# Patient Record
Sex: Male | Born: 1960 | Race: Black or African American | Hispanic: No | Marital: Married | Smoking: Former smoker
Health system: Southern US, Community
[De-identification: ages and names within clinical notes are randomized; demographics above are authoritative.]

## PROBLEM LIST (undated history)

## (undated) DIAGNOSIS — E785 Hyperlipidemia, unspecified: Secondary | ICD-10-CM

## (undated) DIAGNOSIS — R569 Unspecified convulsions: Secondary | ICD-10-CM

## (undated) DIAGNOSIS — I10 Essential (primary) hypertension: Secondary | ICD-10-CM

## (undated) DIAGNOSIS — I671 Cerebral aneurysm, nonruptured: Secondary | ICD-10-CM

## (undated) DIAGNOSIS — I639 Cerebral infarction, unspecified: Secondary | ICD-10-CM

## (undated) HISTORY — DX: Essential (primary) hypertension: I10

## (undated) HISTORY — DX: Hyperlipidemia, unspecified: E78.5

---

## 1996-12-08 DIAGNOSIS — R569 Unspecified convulsions: Secondary | ICD-10-CM

## 1996-12-08 HISTORY — DX: Unspecified convulsions: R56.9

## 1998-09-11 ENCOUNTER — Emergency Department (HOSPITAL_COMMUNITY): Admission: EM | Admit: 1998-09-11 | Discharge: 1998-09-11 | Payer: Self-pay | Admitting: Emergency Medicine

## 1999-02-11 ENCOUNTER — Encounter: Payer: Self-pay | Admitting: Emergency Medicine

## 1999-02-11 ENCOUNTER — Emergency Department (HOSPITAL_COMMUNITY): Admission: EM | Admit: 1999-02-11 | Discharge: 1999-02-11 | Payer: Self-pay | Admitting: Emergency Medicine

## 2008-01-20 ENCOUNTER — Emergency Department (HOSPITAL_COMMUNITY): Admission: EM | Admit: 2008-01-20 | Discharge: 2008-01-20 | Payer: Self-pay | Admitting: Family Medicine

## 2012-02-06 DIAGNOSIS — I639 Cerebral infarction, unspecified: Secondary | ICD-10-CM

## 2012-02-06 HISTORY — DX: Cerebral infarction, unspecified: I63.9

## 2012-02-11 ENCOUNTER — Encounter (HOSPITAL_COMMUNITY): Payer: Self-pay | Admitting: *Deleted

## 2012-02-11 ENCOUNTER — Other Ambulatory Visit: Payer: Self-pay

## 2012-02-11 ENCOUNTER — Emergency Department (HOSPITAL_COMMUNITY): Payer: BC Managed Care – PPO

## 2012-02-11 ENCOUNTER — Inpatient Hospital Stay (HOSPITAL_COMMUNITY): Payer: BC Managed Care – PPO

## 2012-02-11 ENCOUNTER — Inpatient Hospital Stay (HOSPITAL_COMMUNITY)
Admission: EM | Admit: 2012-02-11 | Discharge: 2012-02-13 | DRG: 014 | Disposition: A | Discharge status: Home / Self Care | Payer: BC Managed Care – PPO | Source: Ambulatory Visit | Attending: Internal Medicine | Admitting: Internal Medicine

## 2012-02-11 DIAGNOSIS — Z7982 Long term (current) use of aspirin: Secondary | ICD-10-CM

## 2012-02-11 DIAGNOSIS — Z79899 Other long term (current) drug therapy: Secondary | ICD-10-CM

## 2012-02-11 DIAGNOSIS — Z8673 Personal history of transient ischemic attack (TIA), and cerebral infarction without residual deficits: Secondary | ICD-10-CM

## 2012-02-11 DIAGNOSIS — I10 Essential (primary) hypertension: Secondary | ICD-10-CM | POA: Diagnosis present

## 2012-02-11 DIAGNOSIS — I6350 Cerebral infarction due to unspecified occlusion or stenosis of unspecified cerebral artery: Principal | ICD-10-CM | POA: Diagnosis present

## 2012-02-11 DIAGNOSIS — I639 Cerebral infarction, unspecified: Secondary | ICD-10-CM | POA: Diagnosis present

## 2012-02-11 DIAGNOSIS — Q2111 Secundum atrial septal defect: Secondary | ICD-10-CM

## 2012-02-11 DIAGNOSIS — Q211 Atrial septal defect: Secondary | ICD-10-CM

## 2012-02-11 DIAGNOSIS — I671 Cerebral aneurysm, nonruptured: Secondary | ICD-10-CM | POA: Diagnosis present

## 2012-02-11 DIAGNOSIS — R269 Unspecified abnormalities of gait and mobility: Secondary | ICD-10-CM | POA: Diagnosis present

## 2012-02-11 HISTORY — DX: Unspecified convulsions: R56.9

## 2012-02-11 HISTORY — DX: Cerebral infarction, unspecified: I63.9

## 2012-02-11 LAB — CBC
HCT: 43.5 % (ref 39.0–52.0)
Hemoglobin: 14.9 g/dL (ref 13.0–17.0)
MCH: 29.3 pg (ref 26.0–34.0)
MCHC: 34.3 g/dL (ref 30.0–36.0)
MCV: 85.6 fL (ref 78.0–100.0)
Platelets: 251 K/uL (ref 150–400)
RBC: 5.08 MIL/uL (ref 4.22–5.81)
RDW: 13.4 % (ref 11.5–15.5)
WBC: 6.3 K/uL (ref 4.0–10.5)

## 2012-02-11 LAB — COMPREHENSIVE METABOLIC PANEL
ALT: 17 U/L (ref 0–53)
BUN: 8 mg/dL (ref 6–23)
Calcium: 9.6 mg/dL (ref 8.4–10.5)
GFR calc Af Amer: >90 mL/min (ref >90)
Glucose, Bld: 73 mg/dL (ref 70–99)
Sodium: 139 mEq/L (ref 135–145)
Total Protein: 7.5 g/dL (ref 6.0–8.3)

## 2012-02-11 LAB — DIFFERENTIAL
Basophils Relative: 0 % (ref 0–1)
Eosinophils Absolute: 0.1 10*3/uL (ref 0.0–0.7)
Eosinophils Relative: 1 % (ref 0–5)
Lymphs Abs: 1.7 10*3/uL (ref 0.7–4.0)

## 2012-02-11 LAB — HEMOGLOBIN A1C
Hgb A1c MFr Bld: 5.7 % — ABNORMAL HIGH (ref <5.7)
Mean Plasma Glucose: 117 mg/dL — ABNORMAL HIGH (ref <117)

## 2012-02-11 LAB — GLUCOSE, CAPILLARY: Glucose-Capillary: 95 mg/dL (ref 70–99)

## 2012-02-11 LAB — POCT I-STAT, CHEM 8
Chloride: 103 mEq/L (ref 96–112)
HCT: 47 % (ref 39.0–52.0)
Potassium: 3.7 mEq/L (ref 3.5–5.1)

## 2012-02-11 LAB — TROPONIN I: Troponin I: <0.3 ng/mL (ref <0.30)

## 2012-02-11 LAB — PROTIME-INR
INR: 0.99 (ref 0.00–1.49)
Prothrombin Time: 13.3 s (ref 11.6–15.2)

## 2012-02-11 MED ORDER — SODIUM CHLORIDE 0.9 % IV SOLN
250.0000 mL | INTRAVENOUS | Status: DC | PRN
  Administered 2012-02-13: 500 mL via INTRAVENOUS

## 2012-02-11 MED ORDER — ACETAMINOPHEN 325 MG PO TABS: 650.0000 mg | ORAL_TABLET | ORAL | Status: DC | PRN

## 2012-02-11 MED ORDER — SODIUM CHLORIDE 0.9 % IJ SOLN
3.0000 mL | Freq: Two times a day (BID) | INTRAMUSCULAR | Status: DC
  Administered 2012-02-11 – 2012-02-12 (×4): 3 mL via INTRAVENOUS

## 2012-02-11 MED ORDER — CLOPIDOGREL BISULFATE 75 MG PO TABS
75.0000 mg | ORAL_TABLET | Freq: Every day | ORAL | Status: DC
  Administered 2012-02-12: 75 mg via ORAL
  Filled 2012-02-11 (×3): qty 1

## 2012-02-11 MED ORDER — SODIUM CHLORIDE 0.9 % IJ SOLN: 3.0000 mL | INTRAMUSCULAR | Status: DC | PRN

## 2012-02-11 MED ORDER — ACETAMINOPHEN 650 MG RE SUPP: 650.0000 mg | RECTAL | Status: DC | PRN

## 2012-02-11 MED ORDER — SENNOSIDES-DOCUSATE SODIUM 8.6-50 MG PO TABS: 1.0000 | ORAL_TABLET | Freq: Every evening | ORAL | Status: DC | PRN

## 2012-02-11 MED ORDER — ONDANSETRON HCL 4 MG/2ML IJ SOLN: 4.0000 mg | Freq: Four times a day (QID) | INTRAMUSCULAR | Status: DC | PRN

## 2012-02-11 MED ORDER — ENOXAPARIN SODIUM 40 MG/0.4ML ~~LOC~~ SOLN
40.0000 mg | SUBCUTANEOUS | Status: DC
  Administered 2012-02-11 – 2012-02-12 (×2): 40 mg via SUBCUTANEOUS
  Filled 2012-02-11 (×3): qty 0.4

## 2012-02-11 MED ORDER — ASPIRIN EC 81 MG PO TBEC
81.0000 mg | DELAYED_RELEASE_TABLET | Freq: Every day | ORAL | Status: DC
  Administered 2012-02-11: 81 mg via ORAL
  Filled 2012-02-11 (×2): qty 1

## 2012-02-11 MED ORDER — ADULT MULTIVITAMIN W/MINERALS CH
1.0000 | ORAL_TABLET | Freq: Every day | ORAL | Status: DC
  Administered 2012-02-11 – 2012-02-13 (×3): 1 via ORAL
  Filled 2012-02-11 (×3): qty 1

## 2012-02-11 MED ORDER — HYDRALAZINE HCL 20 MG/ML IJ SOLN
10.0000 mg | Freq: Four times a day (QID) | INTRAMUSCULAR | Status: DC | PRN
  Filled 2012-02-11 (×2): qty 0.5

## 2012-02-11 NOTE — ED Notes (Signed)
Patient transported to MRI 

## 2012-02-11 NOTE — Consult Note (Signed)
[facility] STROKE CONSULT NOTE       Chief Complaint: Left arm and leg tingling and difficulty with gait.    HPI:    Rodney Page is an 51 y.o. male who came to ED after >24 hours of left arm and leg tingling along with some noted difficulty with gait.  He awoke on 3/5 with symptoms and they did not seem to be improving.  In 2009 he had similar tingling in right arm --stroke workup revealed no abnormality.  CT head today showed equivocal stroke involving the right frontal region. 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 No acute right frontal stroke was seen on MRI. MRA showed a 2 mm aneurysm lateral aspect of the left internal carotid artery cavernous segment, as well as a prominent infundibulum at the origin of the left posterior communicating artery and left anterior choroidal artery. He has been on ASA essentially daily, 81 mg.  LSN: evening 02/09/12 tPA Given: No: out of window  Rankin: 0  Past Medical History  Diagnosis Date  . Seizure   . Stroke     possible stroke 2009 per wife    History reviewed. No pertinent past surgical history.  No family history on file. Social History:  does not have a smoking history on file. He does not have any smokeless tobacco history on file. He reports that he does not drink alcohol. His drug history not on file.  Allergies:  Allergies  Allergen Reactions  . Iodinated Diagnostic Agents Other (See Comments)    MRI DYE    Medications:    Takes ASA but not on a regular basis  ROS: History obtained from the patient  General ROS: negative for - chills, fatigue, fever, night sweats, weight gain or weight loss Psychological ROS: negative for - behavioral disorder, hallucinations, memory difficulties, mood swings or suicidal ideation Ophthalmic ROS: negative for - blurry vision, double vision, eye pain or loss of vision ENT ROS: negative for - epistaxis,  nasal discharge, oral lesions, sore throat, tinnitus or vertigo Allergy and Immunology ROS: negative for - hives or itchy/watery eyes Hematological and Lymphatic ROS: negative for - bleeding problems, bruising or swollen lymph nodes Endocrine ROS: negative for - galactorrhea, hair pattern changes, polydipsia/polyuria or temperature intolerance Respiratory ROS: negative for - cough, hemoptysis, shortness of breath or wheezing Cardiovascular ROS: negative for - chest pain, dyspnea on exertion, edema or irregular heartbeat Gastrointestinal ROS: negative for - abdominal pain, diarrhea, hematemesis, nausea/vomiting or stool incontinence Genito-Urinary ROS: negative for - dysuria, hematuria, incontinence or urinary frequency/urgency Musculoskeletal ROS: negative for - joint swelling or muscular weakness Neurological ROS: as noted in HPI Dermatological ROS: negative for rash and skin lesion changes   Physical Examination: Blood pressure 149/85, pulse 68, temperature 97.7 F (36.5 C), resp. rate 14, SpO2 100.00%.  Neurologic Examination:   Mental Status: Alert, oriented, thought content appropriate.  Speech fluent without evidence of aphasia. Able to follow 3 step commands without difficulty. Cranial Nerves: II-Visual fields grossly intact. III/IV/VI-Extraocular movements intact.  Pupils reactive bilaterally. V/VII-Smile asymmetric on left NL fold VIII-grossly intact IX/X-normal gag XI-bilateral shoulder shrug XII-midline tongue extension Motor: 5/5 bilaterally with normal tone and bulk.  Maybe a slight drift with left arm, left weakness in grip Sensory: Pinprick and light touch intact throughout, bilaterally Deep Tendon Reflexes: 2+ and symmetric throughout Plantars: Downgoing bilaterally Cerebellar: Normal finger-to-nose, normal  rapid alternating movements and normal heel-to-shin test.  Normal gait except when heel to toe he will veer to the left.       No results found for this  basename: cbc, bmp, coags, chol, tri, ldl, hga1c, tsh, rpr   Ct Head Wo Contrast  02/11/2012  *RADIOLOGY REPORT*  Clinical Data: Stroke symptoms.  Dizziness.  Lethargy.  CT HEAD WITHOUT CONTRAST  Technique:  Contiguous axial images were obtained from the base of the skull through the vertex without contrast.  Comparison: [facility] Radiology MRI from 03/14/2008.  Findings: There is no evidence for acute hemorrhage, hydrocephalus, mass lesion, or abnormal extra-axial fluid collection.  A small area of hypo attenuation is identified in the right frontal deep white matter.  No definite abnormality is seen at this location on the previous MRI.  The visualized paranasal sinuses and mastoid air cells are clear.  IMPRESSION: A small area of hypo attenuation identified in the posterior right frontal white matter could be an area of subacute ischemia.  MRI may prove helpful to further evaluate.  These test results were telephoned by me to Dr. Lynelle Doctor at the time of interpretation (1120 hours on 02/11/2012).  Original Report Authenticated By: ERIC A. MANSELL, M.D.   [name] Head Wo Contrast  02/11/2012  *RADIOLOGY REPORT*  Clinical Data:  Off balance.  Left leg and left arm draining. History of seizures.  MRI HEAD WITHOUT CONTRAST MRA HEAD WITHOUT CONTRAST  Technique: Multiplanar, multiecho pulse sequences of the brain and surrounding structures were obtained according to standard protocol without intravenous contrast.  Angiographic images of the head were obtained using MRA technique without contrast.  Comparison: 02/11/2012 CT.  03/14/2008 MR.  MRI HEAD  Findings:  Small acute non hemorrhagic infarct posterior limb of the right internal capsule.  Remote infarcts within the thalami, basal ganglia and pons.  Progressive white matter type changes most likely related to result of small vessel disease given the surrounding findings.  Result of vasculitis not entirely excluded.  No hydrocephalus.  No intracranial mass lesion  detected on this unenhanced exam.  No intracranial hemorrhage.  Mild degenerative changes C1-2 articulation.  Primary veins extending through parietal calvarium.  Appearance is unchanged.  IMPRESSION: Small acute non hemorrhagic infarct posterior limb of the right internal capsule.  Remote infarcts within the thalami, basal ganglia and pons.  Progressive white matter type changes most likely related to result of small vessel disease  MRA HEAD  Findings: Mild narrowing petrous segment of the internal carotid artery bilaterally greater on the right.  Ectasia of the distal vertical cervical segment of the internal carotid artery bilaterally greater on the right.  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  Stability can be confirmed on follow-up.  Bulbous appearance of the left middle cerebral artery bifurcation from which vessels arise.  Fetal type origin of the right posterior cerebral artery.  Middle cerebral artery branch vessel irregularity.  Smooth narrowing left vertebral artery after the takeoff of the left PICA.  Mild irregularity of the mid aspect of the basilar artery with associated mild narrowing.  Moderate narrowing P1 segments right posterior cerebral artery.  Mild narrowing posterior cerebral artery branch vessels.  IMPRESSION: Intracranial atherosclerotic type changes as detailed above.  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  Prominent infundibulum at the origin of the left  posterior communicating artery and left anterior choroidal artery.  Stability can be confirmed on follow-up.  Results discussed with Dr. Lynelle Doctor 02/11/2012 2:16 p.m.  Original Report Authenticated By: Fuller Canada, M.D.   Mr Brain Wo Contrast  02/11/2012  *RADIOLOGY REPORT*  Clinical Data:  Off balance.  Left leg and left arm  draining. History of seizures.  MRI HEAD WITHOUT CONTRAST MRA HEAD WITHOUT CONTRAST  Technique: Multiplanar, multiecho pulse sequences of the brain and surrounding structures were obtained according to standard protocol without intravenous contrast.  Angiographic images of the head were obtained using MRA technique without contrast.  Comparison: 02/11/2012 CT.  03/14/2008 MR.  MRI HEAD  Findings:  Small acute non hemorrhagic infarct posterior limb of the right internal capsule.  Remote infarcts within the thalami, basal ganglia and pons.  Progressive white matter type changes most likely related to result of small vessel disease given the surrounding findings.  Result of vasculitis not entirely excluded.  No hydrocephalus.  No intracranial mass lesion detected on this unenhanced exam.  No intracranial hemorrhage.  Mild degenerative changes C1-2 articulation.  Primary veins extending through parietal calvarium.  Appearance is unchanged.  IMPRESSION: Small acute non hemorrhagic infarct posterior limb of the right internal capsule.  Remote infarcts within the thalami, basal ganglia and pons.  Progressive white matter type changes most likely related to result of small vessel disease  MRA HEAD  Findings: Mild narrowing petrous segment of the internal carotid artery bilaterally greater on the right.  Ectasia of the distal vertical cervical segment of the internal carotid artery bilaterally greater on the right.  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  Stability can be confirmed on follow-up.  Bulbous appearance of the left middle cerebral artery bifurcation from which vessels arise.  Fetal type origin of the right posterior cerebral artery.  Middle cerebral artery branch vessel irregularity.  Smooth narrowing left vertebral artery after the takeoff of the left  PICA.  Mild irregularity of the mid aspect of the basilar artery with associated mild narrowing.  Moderate narrowing P1 segments right posterior cerebral artery.  Mild narrowing posterior cerebral artery branch vessels.  IMPRESSION: Intracranial atherosclerotic type changes as detailed above.  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  Stability can be confirmed on follow-up.  Results discussed with Dr. Lynelle Doctor 02/11/2012 2:16 p.m.  Original Report Authenticated By: Fuller Canada, M.D.    Assessment:   1. 51 y.o. male with new acute small acute non hemorrhagic infarct posterior limb of the right internal capsule.   2. 2mm left ICA cavernous segment aneurysm  3. 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  Stroke Risk Factors - none documented  Plan: 1. HgbA1c, fasting lipid panel 2. PT consult, OT consult, Speech consult 3. Echocardiogram 4. Carotid dopplers 5. Prophylactic therapy-Antiplatelet med: Plavix 75 mg daily 6. Risk factor modification 7. Further cerebral vascular workup per stroke team    Felicie Morn PA-C [facility] [phone]  02/11/2012, 2:46 PM

## 2012-02-11 NOTE — ED Notes (Signed)
Attempted to call report and they will call me back in 5 min

## 2012-02-11 NOTE — ED Notes (Signed)
Pt still in mri 

## 2012-02-11 NOTE — ED Notes (Signed)
Pt is here after seeing PCP who referred him here due to stroke symptoms that began yesterday am.  LSN 3/5 12:30 am.   Pt woke up yesterday at 6:30 am feeling "a little off balance"  Pt states that he "felt unusual" and tired during the day.  Pt states that his left leg and left arm felt "unusual, not normal" and he was tired and "dragging".  He also had some slurred speech during the day.  Pt has mild left facial droop here on arrival to ED, he also has minimal left arm weakness, very minimal left arm drift.  No dizziness.  Pt is alert and oriented

## 2012-02-11 NOTE — ED Notes (Signed)
Allergy "MRI dye"

## 2012-02-11 NOTE — Progress Notes (Signed)
Utilization review completed.  

## 2012-02-11 NOTE — ED Notes (Signed)
Checked patient blood sugar it was 95 notified RN Sabrina of blood sugar

## 2012-02-11 NOTE — ED Provider Notes (Signed)
History     CSN: [id number]  Arrival date & time 02/11/12  4098   First MD Initiated Contact with Patient 02/11/12 [phone]      Chief Complaint  Patient presents with  . Cerebrovascular Accident    (Consider location/radiation/quality/duration/timing/severity/associated sxs/prior treatment) HPI The patient was sent to the emergency room for evaluation of a possible stroke. The patient states he went to bed the evening before yesterday and felt fine. He woke up yesterday morning and noticed that his balance fell off. He felt like he was falling to the left. He felt fatigued during the day. He also felt like his left leg and left arm were dragging and felt a little bit. He did not notice any trouble with his speech however family members and coworkers thought it seems different. The patient has a history of a possible stroke back in 2009. He has history of seizures but has not had any in many years. The symptoms have persisted. Nothing seems to make them better or worse. He still feels like he is falling a little bit to the left side. Past Medical History  Diagnosis Date  . Seizure   . Stroke     possible stroke 2009 per wife    History reviewed. No pertinent past surgical history.  No family history on file.  History  Substance Use Topics  . Smoking status: Not on file  . Smokeless tobacco: Not on file  . Alcohol Use: No      Review of Systems  All other systems reviewed and are negative.    Allergies  Iodinated diagnostic agents  Home Medications  No current outpatient prescriptions on file.  BP 161/88  Pulse 72  Temp 98.7 F (37.1 C)  Resp 22  SpO2 100%  Physical Exam  Nursing note and vitals reviewed. Constitutional: He is oriented to person, place, and time. He appears well-developed and well-nourished. No distress.  HENT:  Head: Normocephalic and atraumatic.  Right Ear: External ear normal.  Left Ear: External ear normal.  Mouth/Throat: Oropharynx is  clear and moist.  Eyes: Conjunctivae are normal. Right eye exhibits no discharge. Left eye exhibits no discharge. No scleral icterus.  Neck: Neck supple. No tracheal deviation present.  Cardiovascular: Normal rate, regular rhythm and intact distal pulses.   Pulmonary/Chest: Effort normal and breath sounds normal. No stridor. No respiratory distress. He has no wheezes. He has no rales.  Abdominal: Soft. Bowel sounds are normal. He exhibits no distension. There is no tenderness. There is no rebound and no guarding.  Musculoskeletal: He exhibits no edema and no tenderness.  Neurological: He is alert and oriented to person, place, and time. He has normal strength. He is not disoriented. No cranial nerve deficit or sensory deficit. He exhibits normal muscle tone. He displays no seizure activity. Gait abnormal.       Slight pronator drift of the left upper extremity and slight drift of left lower extremity, able to hold both legs off bed for 5 seconds, sensation intact in all extremities, no visual field cuts, no left or right sided neglect  Skin: Skin is warm and dry. No rash noted.  Psychiatric: He has a normal mood and affect.    ED Course  Procedures (including critical care time)  Date: 02/11/2012  Rate: 71  Rhythm: normal sinus rhythm  QRS Axis: normal  Intervals: normal  ST/T Wave abnormalities: normal except borderline T-wave abnormalities in the lateral leads  Conduction Disutrbances:none  Narrative Interpretation: Consider left atrial  abnormality  Old EKG Reviewed: none available    Labs Reviewed  PROTIME-INR  APTT  CBC  DIFFERENTIAL  COMPREHENSIVE METABOLIC PANEL  TROPONIN I  POCT I-STAT, CHEM 8   Ct Head Wo Contrast  02/11/2012  *RADIOLOGY REPORT*  Clinical Data: Stroke symptoms.  Dizziness.  Lethargy.  CT HEAD WITHOUT CONTRAST  Technique:  Contiguous axial images were obtained from the base of the skull through the vertex without contrast.  Comparison: [facility]  Radiology MRI from 03/14/2008.  Findings: There is no evidence for acute hemorrhage, hydrocephalus, mass lesion, or abnormal extra-axial fluid collection.  A small area of hypo attenuation is identified in the right frontal deep white matter.  No definite abnormality is seen at this location on the previous MRI.  The visualized paranasal sinuses and mastoid air cells are clear.  IMPRESSION: A small area of hypo attenuation identified in the posterior right frontal white matter could be an area of subacute ischemia.  MRI may prove helpful to further evaluate.  These test results were telephoned by me to Dr. Lynelle Doctor at the time of interpretation (1120 hours on 02/11/2012).  Original Report Authenticated By: ERIC A. MANSELL, M.D.     1. Cerebrovascular accident (stroke)       MDM  The patient's CT scan is suggestive of a subacute stroke. This does correlate with the symptoms that he is experiencing today. The patient is not a code stroke/TPA candidate and that the onset was greater than 24 hours ago. However, the patient will be admitted to the hospital for further treatment and evaluation of this subacute stroke.        Celene Kras, MD 02/11/12 1149

## 2012-02-11 NOTE — H&P (Signed)
Patient's PCP: No primary provider on file.  Chief Complaint: difficulty walking and weakness  History of Present Illness: Rodney Page is a 51 y.o. african [place] male who woke up yesterday morning and noticed that his balance felt off. He felt like he was falling to the left. He felt fatigued during the day. He also felt like his left leg and left arm were dragging.  He did not notice any trouble with his speech however his  family members and coworkers thought it seemed different. The patient has a history of a possible stroke back in 2009. He has history of seizures but has not had any in many years. The symptoms have persisted.  He still feels like he is falling a little bit to the left side. No CP,no SOB   Meds: Scheduled Meds:    . aspirin EC  81 mg Oral Daily  . clopidogrel  75 mg Oral Q breakfast  . enoxaparin  40 mg Subcutaneous Q24H  . mulitivitamin with minerals  1 tablet Oral Daily  . sodium chloride  3 mL Intravenous Q12H   Continuous Infusions:  PRN Meds:.sodium chloride, acetaminophen, acetaminophen, ondansetron (ZOFRAN) IV, senna-docusate, sodium chloride Allergies: Iodinated diagnostic agents Past Medical History  Diagnosis Date  . Seizure   . Stroke     possible stroke 2009 per wife   History reviewed. No pertinent past surgical history. No family history on file. History   Social History  . Marital Status: Married    Spouse Name: N/A    Number of Children: N/A  . Years of Education: N/A   Occupational History  . Not on file.   Social History Main Topics  . Smoking status: Not on file  . Smokeless tobacco: Not on file  . Alcohol Use: No  . Drug Use: No  . Sexually Active:    Other Topics Concern  . Not on file   Social History Narrative  . No narrative on file   Review of Systems: All systems reviewed with the patient and positive as per history of present illness, otherwise all other systems are negative. Pertinent items are noted  in HPI.  Physical Exam: Blood pressure 181/99, pulse 73, temperature 97.2 F (36.2 C), temperature source Oral, resp. rate 17, height 6' (1.829 m), weight 68.9 kg (151 lb 14.4 oz), SpO2 100.00%. General: Awake, Oriented x3, No acute distress, family at bedside HEENT: EOMI, Moist mucous membranes Neck: Supple CV: S1 and S2, RRR Lungs: Clear to ascultation bilaterally, no wheezing Abdomen: Soft, Nontender, Nondistended, +bowel sounds. Ext: Good pulses. Trace edema. No clubbing or cyanosis noted. Neuro: mild facial droop, weakness on L side    Lab results:  Basename 02/11/12 1020 02/11/12 1012  NA 140 139  K 3.7 3.6  CL 103 104  CO2 -- 28  GLUCOSE 73 73  BUN 8 8  CREATININE 0.80 0.70  CALCIUM -- 9.6  MG -- --  PHOS -- --    Basename 02/11/12 1012  AST 15  ALT 17  ALKPHOS 84  BILITOT 0.5  PROT 7.5  ALBUMIN 4.2   No results found for this basename: LIPASE:2,AMYLASE:2 in the last 72 hours  Basename 02/11/12 1020 02/11/12 1012  WBC -- 6.3  NEUTROABS -- 4.2  HGB 16.0 14.9  HCT 47.0 43.5  MCV -- 85.6  PLT -- 251    Basename 02/11/12 1012  CKTOTAL --  CKMB --  CKMBINDEX --  TROPONINI <0.30   No components found with this basename:  POCBNP:3 No results found for this basename: DDIMER in the last 72 hours No results found for this basename: HGBA1C:2 in the last 72 hours No results found for this basename: CHOL:2,HDL:2,LDLCALC:2,TRIG:2,CHOLHDL:2,LDLDIRECT:2 in the last 72 hours No results found for this basename: TSH,T4TOTAL,FREET3,T3FREE,THYROIDAB in the last 72 hours No results found for this basename: VITAMINB12:2,FOLATE:2,FERRITIN:2,TIBC:2,IRON:2,RETICCTPCT:2 in the last 72 hours Imaging results:  Ct Head Wo Contrast  02/11/2012  *RADIOLOGY REPORT*  Clinical Data: Stroke symptoms.  Dizziness.  Lethargy.  CT HEAD WITHOUT CONTRAST  Technique:  Contiguous axial images were obtained from the base of the skull through the vertex without contrast.  Comparison:  [facility] Radiology MRI from 03/14/2008.  Findings: There is no evidence for acute hemorrhage, hydrocephalus, mass lesion, or abnormal extra-axial fluid collection.  A small area of hypo attenuation is identified in the right frontal deep white matter.  No definite abnormality is seen at this location on the previous MRI.  The visualized paranasal sinuses and mastoid air cells are clear.  IMPRESSION: A small area of hypo attenuation identified in the posterior right frontal white matter could be an area of subacute ischemia.  MRI may prove helpful to further evaluate.  These test results were telephoned by me to Dr. Lynelle Doctor at the time of interpretation (1120 hours on 02/11/2012).  Original Report Authenticated By: ERIC A. MANSELL, M.D.   [name] Head Wo Contrast  02/11/2012  *RADIOLOGY REPORT*  Clinical Data:  Off balance.  Left leg and left arm draining. History of seizures.  MRI HEAD WITHOUT CONTRAST MRA HEAD WITHOUT CONTRAST  Technique: Multiplanar, multiecho pulse sequences of the brain and surrounding structures were obtained according to standard protocol without intravenous contrast.  Angiographic images of the head were obtained using MRA technique without contrast.  Comparison: 02/11/2012 CT.  03/14/2008 MR.  MRI HEAD  Findings:  Small acute non hemorrhagic infarct posterior limb of the right internal capsule.  Remote infarcts within the thalami, basal ganglia and pons.  Progressive white matter type changes most likely related to result of small vessel disease given the surrounding findings.  Result of vasculitis not entirely excluded.  No hydrocephalus.  No intracranial mass lesion detected on this unenhanced exam.  No intracranial hemorrhage.  Mild degenerative changes C1-2 articulation.  Primary veins extending through parietal calvarium.  Appearance is unchanged.  IMPRESSION: Small acute non hemorrhagic infarct posterior limb of the right internal capsule.  Remote infarcts within the thalami, basal  ganglia and pons.  Progressive white matter type changes most likely related to result of small vessel disease  MRA HEAD  Findings: Mild narrowing petrous segment of the internal carotid artery bilaterally greater on the right.  Ectasia of the distal vertical cervical segment of the internal carotid artery bilaterally greater on the right.  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  Stability can be confirmed on follow-up.  Bulbous appearance of the left middle cerebral artery bifurcation from which vessels arise.  Fetal type origin of the right posterior cerebral artery.  Middle cerebral artery branch vessel irregularity.  Smooth narrowing left vertebral artery after the takeoff of the left PICA.  Mild irregularity of the mid aspect of the basilar artery with associated mild narrowing.  Moderate narrowing P1 segments right posterior cerebral artery.  Mild narrowing posterior cerebral artery branch vessels.  IMPRESSION: Intracranial atherosclerotic type changes as detailed above.  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  Stability can be confirmed on follow-up.  Results discussed with Dr. Lynelle Doctor 02/11/2012 2:16 p.m.  Original Report Authenticated By: Fuller Canada, M.D.   Mr Brain Wo Contrast  02/11/2012  *RADIOLOGY REPORT*  Clinical Data:  Off balance.  Left leg and left arm draining. History of seizures.  MRI HEAD WITHOUT CONTRAST MRA HEAD WITHOUT CONTRAST  Technique: Multiplanar, multiecho pulse sequences of the brain and surrounding structures were obtained according to standard protocol without intravenous contrast.  Angiographic images of the head were obtained using MRA technique without  contrast.  Comparison: 02/11/2012 CT.  03/14/2008 MR.  MRI HEAD  Findings:  Small acute non hemorrhagic infarct posterior limb of the right internal capsule.  Remote infarcts within the thalami, basal ganglia and pons.  Progressive white matter type changes most likely related to result of small vessel disease given the surrounding findings.  Result of vasculitis not entirely excluded.  No hydrocephalus.  No intracranial mass lesion detected on this unenhanced exam.  No intracranial hemorrhage.  Mild degenerative changes C1-2 articulation.  Primary veins extending through parietal calvarium.  Appearance is unchanged.  IMPRESSION: Small acute non hemorrhagic infarct posterior limb of the right internal capsule.  Remote infarcts within the thalami, basal ganglia and pons.  Progressive white matter type changes most likely related to result of small vessel disease  MRA HEAD  Findings: Mild narrowing petrous segment of the internal carotid artery bilaterally greater on the right.  Ectasia of the distal vertical cervical segment of the internal carotid artery bilaterally greater on the right.  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  Stability can be confirmed on follow-up.  Bulbous appearance of the left middle cerebral artery bifurcation from which vessels arise.  Fetal type origin of the right posterior cerebral artery.  Middle cerebral artery branch vessel irregularity.  Smooth narrowing left vertebral artery after the takeoff of the left PICA.  Mild irregularity of the mid aspect of the basilar artery with associated mild narrowing.  Moderate narrowing P1 segments right posterior cerebral artery.  Mild narrowing posterior cerebral artery branch vessels.  IMPRESSION: Intracranial atherosclerotic type changes as detailed above.  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  Stability can be confirmed on follow-up.  Results discussed with Dr. Lynelle Doctor 02/11/2012 2:16 p.m.  Original Report Authenticated By: Fuller Canada, M.D.     Assessment & Plan by Problem:   *CVA (cerebral infarction)- ? TIA in 2009- neurology consulted, full stroke w/up, MRI/Carotids/echo FLP, ASA   HTN (hypertension)- BP stable, permissive HTN for now   Time spent on admission, talking to the patient, and coordinating care was: 31 mins.  Brodi Nery, DO 02/11/2012, 3:11 PM

## 2012-02-11 NOTE — ED Notes (Signed)
No old ekgs found in muse. 

## 2012-02-12 DIAGNOSIS — I319 Disease of pericardium, unspecified: Secondary | ICD-10-CM

## 2012-02-12 LAB — LIPID PANEL
HDL: 59 mg/dL (ref >39)
LDL Cholesterol: 53 mg/dL (ref 0–99)
Total CHOL/HDL Ratio: 2.2 RATIO
Triglycerides: 98 mg/dL (ref <150)

## 2012-02-12 LAB — HOMOCYSTEINE: Homocysteine: 6.5 umol/L (ref 4.0–15.4)

## 2012-02-12 LAB — HEMOGLOBIN A1C: Hgb A1c MFr Bld: 5.7 % — ABNORMAL HIGH (ref <5.7)

## 2012-02-12 NOTE — Progress Notes (Signed)
VASCULAR LAB PRELIMINARY  PRELIMINARY  PRELIMINARY  PRELIMINARY  Carotid Dopplers completed.    Preliminary report:  No ICA stenosis.  Vertebral artery flow is antegrade.  Sherren Kerns Breaks, 02/12/2012, 10:54 AM

## 2012-02-12 NOTE — Progress Notes (Signed)
  Echocardiogram 2D Echocardiogram has been performed.  Cathie Beams Deneen 02/12/2012, 11:53 AM

## 2012-02-12 NOTE — Progress Notes (Signed)
Occupational Therapy Evaluation Patient Details Name: Rodney Page MRN: [id number] DOB: 09-13-1961 Today's Date: 02/12/2012  Problem List:  Patient Active Problem List  Diagnoses  . CVA (cerebral infarction)  . HTN (hypertension)    Past Medical History:  Past Medical History  Diagnosis Date  . Seizure   . Stroke     possible stroke 2009 per wife   Past Surgical History: History reviewed. No pertinent past surgical history.  OT Assessment/Plan/Recommendation OT Assessment Clinical Impression Statement: Pt admitted with CVA affecting Lt side.  Pt able to perform functional mobility and BADLs at mod I/ supervision level.  Pt notes some difficulty with functional use of Lt. hand with tasks such as opening containers.  Pt may benefit from outpatient OT to regain strength and coordination in Lt. hand.  Pt with no further acute OT needs.  Educated pt on stroke signs and symptoms. OT Recommendation/Assessment: Patient does not need any further OT services OT Recommendation Follow Up Recommendations: Outpatient OT Equipment Recommended: None recommended by PT;None recommended by OT OT Goals    OT Evaluation Precautions/Restrictions  Precautions Precautions:  (None) Restrictions Weight Bearing Restrictions: No Prior Functioning Home Living Lives With: Spouse Receives Help From: Family Type of Home: House Home Layout: Two level;Able to live on main level with bedroom/bathroom Home Access: Stairs to enter Entrance Stairs-Rails: None Entrance Stairs-Number of Steps: 3 Bathroom Shower/Tub: Health visitor: Standard Bathroom Accessibility: Yes Home Adaptive Equipment: None Prior Function Level of Independence: Independent with basic ADLs;Independent with homemaking with ambulation;Independent with gait;Independent with transfers Able to Take Stairs?: Yes Driving: Yes 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 socks. Where Assessed - Lower Body Dressing: Sit to stand from bed Toilet Transfer: Simulated;Supervision/safety Toilet Transfer Details (indicate cue type and reason): supervision for safety with lines Toilet Transfer Method: Ambulating Toilet Transfer Equipment: Other (comment) (sitting EOB) Tub/Shower Transfer: Landscape architect Details (indicate cue type and reason): supervision for safety with lines Tub/Shower Transfer Method: Ambulating Ambulation Related to ADLs: Pt with report that Lt. LE feels "funny" ADL Comments: Pt reports some difficulty with opening containers such as water bottle. Vision/Perception  Vision - History Baseline Vision: Wears glasses all the time Patient Visual Report: No change from baseline Cognition Cognition Arousal/Alertness: Awake/alert Overall Cognitive Status: Appears within functional limits for tasks assessed Orientation Level: Oriented X4 Sensation/Coordination Sensation Light Touch: Appears Intact Additional Comments: pt noting L LE "feels funny" however sensation intact and pt notes Bil feels the same. Lt hand feels a little numb, but able to detect touch.   Coordination Gross Motor Movements are Fluid and Coordinated: Yes Fine Motor Movements are Fluid and Coordinated: No (slightly less fluid in Lt. hand) Extremity Assessment RUE Assessment RUE Assessment: Within Functional Limits LUE Assessment LUE Assessment: Within Functional Limits Mobility  Bed Mobility Bed Mobility: Yes Supine to Sit: 7: Independent;HOB flat Sitting - Scoot to Edge of Bed: 7: Independent Sit to Supine: 7: Independent;HOB flat Transfers Sit to Stand: 6: Modified independent (Device/Increase  time);With upper extremity assist;From bed Stand to Sit: 6: Modified independent (Device/Increase time);With upper extremity assist;To bed Exercises   End of Session OT - End of Session Equipment Utilized During Treatment: Gait belt Activity Tolerance: Patient tolerated treatment well Patient left: in bed;with call  bell in reach;with family/visitor present Nurse Communication: Mobility status for transfers General Behavior During Session: [facility] for tasks performed Cognition: [facility] for tasks performed   4:37 PM  02/12/2012 Cipriano Mile OTR/L Pager [phone] Office [phone]

## 2012-02-12 NOTE — Progress Notes (Signed)
Physical Therapy Evaluation Patient Details Name: Rodney Page MRN: [id number] DOB: [birth year] Today's Date: 02/12/2012  Problem List:  Patient Active Problem List  Diagnoses  . CVA (cerebral infarction)  . HTN (hypertension)    Past Medical History:  Past Medical History  Diagnosis Date  . Seizure   . Stroke     possible stroke 2009 per wife   Past Surgical History: History reviewed. No pertinent past surgical history.  PT Assessment/Plan/Recommendation PT Assessment Clinical Impression Statement: pt presents with CVA.  pt moving well, just mildly unsteady and question if this is because pt notes L LE "feels funny".  pt would benefit from high level balance with OPPT.  pt ed on s/s CVA.   PT Recommendation/Assessment: Patient will need skilled PT in the acute care venue PT Problem List: Decreased activity tolerance;Decreased balance;Decreased mobility Barriers to Discharge: None PT Therapy Diagnosis : Difficulty walking PT Plan PT Frequency: Min 4X/week PT Treatment/Interventions: Gait training;Stair training;Functional mobility training;Therapeutic activities;Balance training;Neuromuscular re-education;Patient/family education PT Recommendation Follow Up Recommendations: Outpatient PT Equipment Recommended: None recommended by PT PT Goals  Acute Rehab PT Goals PT Goal Formulation: With patient Time For Goal Achievement: 7 days Pt will Ambulate: >150 feet;Independently PT Goal: Ambulate - Progress: Goal set today Pt will Go Up / Down Stairs: Flight;Independently;with rail(s) PT Goal: Up/Down Stairs - Progress: Goal set today  PT Evaluation Precautions/Restrictions  Precautions Precautions:  (None) Restrictions Weight Bearing Restrictions: No Prior Functioning  Home Living Lives With: Spouse Receives Help From: Family Type of Home: House Home Layout: Two level;Able to live on main level with bedroom/bathroom Home Access: Stairs to enter Entrance  Stairs-Rails: None Entrance Stairs-Number of Steps: 3 Bathroom Shower/Tub: Health visitor: Standard Bathroom Accessibility: Yes Home Adaptive Equipment: None Prior Function Level of Independence: Independent with basic ADLs;Independent with homemaking with ambulation;Independent with gait;Independent with transfers Able to Take Stairs?: Yes Driving: Yes 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   Extremity Assessment RLE Assessment RLE Assessment: Within Functional Limits LLE Assessment LLE Assessment: Within Functional Limits Mobility (including Balance) Bed Mobility Bed Mobility: Yes Supine to Sit: 7: Independent;HOB flat Sitting - Scoot to Edge of Bed: 7: Independent Sit to Supine: 7: Independent;HOB flat Transfers Transfers: Yes Sit to Stand: 6: Modified independent (Device/Increase time);With upper extremity assist;From bed Stand to Sit: 6: Modified independent (Device/Increase time);With upper extremity assist;To bed Ambulation/Gait Ambulation/Gait: Yes Ambulation/Gait Assistance: 5: Supervision Ambulation/Gait Assistance Details (indicate cue type and reason): pt mildly unsteady with steppage gait on L.  ?steppage secondary to pt c/o LE "feeling funny".   Ambulation Distance (Feet): 200 Feet Assistive device: None Gait Pattern: Step-through pattern;Left steppage Stairs: Yes Stairs Assistance: 5: Supervision Stairs Assistance Details (indicate cue type and reason): demos good technique without rail.   Stair Management Technique: No rails;Forwards Number of Stairs: 2  Wheelchair Mobility Wheelchair Mobility: No  Posture/Postural Control Posture/Postural Control: No significant  limitations Balance Balance Assessed: Yes Dynamic Gait Index Level Surface: Normal Change in Gait Speed: Mild Impairment Gait with Horizontal Head Turns: Mild Impairment Gait with Vertical Head Turns: Mild Impairment Gait and Pivot Turn: Normal Step Over Obstacle: Mild Impairment Step Around Obstacles: Normal Steps: Mild Impairment Total Score: 19  Exercise    End of Session PT - End of Session Equipment Utilized During Treatment: Gait  belt Activity Tolerance: Patient tolerated treatment well Patient left: in bed;with call bell in reach;with family/visitor present Nurse Communication: Mobility status for transfers;Mobility status for ambulation General Behavior During Session: [facility] for tasks performed Cognition: [facility] for tasks performed  Sunny Schlein, [place] [phone] 02/12/2012, 2:46 PM

## 2012-02-12 NOTE — Progress Notes (Signed)
Subjective:  Rodney Page is an 51 y.o. male who came to ED after >24 hours of left arm and leg tingling along with some noted difficulty with gait. He awoke on 3/5 with symptoms and they did not seem to be improving. In 2009 he had similar tingling in right arm --stroke workup revealed no abnormality. CT head today showed equivocal stroke involving the right frontal region. 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 No acute right frontal stroke was seen on MRI. 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 He denies any new symptoms. He denies any known significant vascular risk factors. There is no history of deepened thrombosis, pulmonary embolism or rash. He does have one cousin who had a stroke in his 30s  Objective: Vital signs in last 24 hours: Temp:  [97.2 F (36.2 C)-98.2 F (36.8 C)] 97.7 F (36.5 C) (03/07 0600) Pulse Rate:  [61-73] 68  (03/07 0600) Resp:  [14-20] 18  (03/07 0600) BP: (130-181)/(79-99) 145/86 mmHg (03/07 0600) SpO2:  [95 %-100 %] 97 % (03/07 0600) Weight:  [68.9 kg (151 lb 14.4 oz)] 68.9 kg (151 lb 14.4 oz) (03/06 1509) Weight change:  Last BM Date: 02/09/12  Intake/Output from previous day: 03/06 0701 - 03/07 0700 In: 3 [I.V.:3] Out: -  Intake/Output this shift:    Physical exam reveals healthy African American young male not in distress. Afebrile. Head is nontraumatic. Neck is supple without bruit. Hearing is normal.Cardiac exam no murmur or gallop. Lungs clear to auscultation. Abdomen soft nontender Neurological Exam Awake alert oriented x 3 normal speech and language. Mild left lower face asymmetry. Tongue midline. No drift. Mild diminished fine finger movements on left. Orbits  right over left upper extremity. Mild left grip weak.. Normal sensation . Normal coordination. Gait deferred Lab Results:  Basename 02/11/12 1020 02/11/12 1012  WBC -- 6.3  HGB 16.0 14.9  HCT 47.0 43.5  PLT -- 251   BMET  Basename 02/11/12 1020 02/11/12 1012  NA 140 139  K 3.7 3.6  CL 103 104  CO2 -- 28  GLUCOSE 73 73  BUN 8 8  CREATININE 0.80 0.70  CALCIUM -- 9.6    Studies/Results: Dg Chest 2 View  02/11/2012  *RADIOLOGY REPORT*  Clinical Data: Acute stroke.  CHEST - 2 VIEW  Comparison: None.  Findings: The heart size and pulmonary vascularity are normal and the lungs are clear.  No osseous abnormality.  IMPRESSION: Normal chest.  Original Report Authenticated By: Gwynn Burly, M.D.   Ct Head Wo Contrast  02/11/2012  *RADIOLOGY REPORT*  Clinical Data: Stroke symptoms.  Dizziness.  Lethargy.  CT HEAD WITHOUT CONTRAST  Technique:  Contiguous axial images were obtained from the base of the skull through the vertex without contrast.  Comparison: [facility] Rad[facility] MRI from 03/14/2008.  Findings: There is no evidence for acute hemorrhage, hydrocephalus, mass lesion, or abnormal extra-axial fluid collection.  A small area of hypo attenuation is identified in the right frontal deep white matter.  No definite abnormality is seen at this location on the previous MRI.  The visualized paranasal sinuses and mastoid air cells are clear.  IMPRESSION: A small area of hypo attenuation identified in the posterior right frontal white matter could be  an area of subacute ischemia.  MRI may prove helpful to further evaluate.  These test results were telephoned by me to Dr. Lynelle Doctor at the time of interpretation (1120 hours on 02/11/2012).  Original Report Authenticated By: ERIC A. MANSELL, M.D.   [name] Head Wo Contrast  02/11/2012  *RADIOLOGY REPORT*  Clinical Data:  Off balance.  Left leg and left arm draining. History of seizures.  MRI HEAD WITHOUT CONTRAST MRA HEAD WITHOUT CONTRAST  Technique:  Multiplanar, multiecho pulse sequences of the brain and surrounding structures were obtained according to standard protocol without intravenous contrast.  Angiographic images of the head were obtained using MRA technique without contrast.  Comparison: 02/11/2012 CT.  03/14/2008 MR.  MRI HEAD  Findings:  Small acute non hemorrhagic infarct posterior limb of the right internal capsule.  Remote infarcts within the thalami, basal ganglia and pons.  Progressive white matter type changes most likely related to result of small vessel disease given the surrounding findings.  Result of vasculitis not entirely excluded.  No hydrocephalus.  No intracranial mass lesion detected on this unenhanced exam.  No intracranial hemorrhage.  Mild degenerative changes C1-2 articulation.  Primary veins extending through parietal calvarium.  Appearance is unchanged.  IMPRESSION: Small acute non hemorrhagic infarct posterior limb of the right internal capsule.  Remote infarcts within the thalami, basal ganglia and pons.  Progressive white matter type changes most likely related to result of small vessel disease  MRA HEAD  Findings: Mild narrowing petrous segment of the internal carotid artery bilaterally greater on the right.  Ectasia of the distal vertical cervical segment of the internal carotid artery bilaterally greater on the right.  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  Stability can be confirmed on follow-up.  Bulbous appearance of the left middle cerebral artery bifurcation from which vessels arise.  Fetal type origin of the right posterior cerebral artery.  Middle cerebral artery branch vessel irregularity.  Smooth narrowing left vertebral artery after the takeoff of the left PICA.  Mild irregularity of the mid aspect of the basilar artery with associated mild narrowing.   Moderate narrowing P1 segments right posterior cerebral artery.  Mild narrowing posterior cerebral artery branch vessels.  IMPRESSION: Intracranial atherosclerotic type changes as detailed above.  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  Stability can be confirmed on follow-up.  Results discussed with Dr. Lynelle Doctor 02/11/2012 2:16 p.m.  Original Report Authenticated By: Fuller Canada, M.D.   Mr Brain Wo Contrast  02/11/2012  *RADIOLOGY REPORT*  Clinical Data:  Off balance.  Left leg and left arm draining. History of seizures.  MRI HEAD WITHOUT CONTRAST MRA HEAD WITHOUT CONTRAST  Technique: Multiplanar, multiecho pulse sequences of the brain and surrounding structures were obtained according to standard protocol without intravenous contrast.  Angiographic images of the head were obtained using MRA technique without contrast.  Comparison: 02/11/2012 CT.  03/14/2008 MR.  MRI HEAD  Findings:  Small acute non hemorrhagic infarct posterior limb of the right internal capsule.  Remote infarcts within the thalami, basal ganglia and pons.  Progressive white matter type changes most likely related to result of small vessel disease given the surrounding findings.  Result of vasculitis not entirely excluded.  No hydrocephalus.  No intracranial mass lesion detected on this unenhanced exam.  No intracranial hemorrhage.  Mild degenerative changes C1-2 articulation.  Primary veins extending through parietal calvarium.  Appearance is unchanged.  IMPRESSION: Small acute non hemorrhagic infarct posterior limb of the right internal capsule.  Remote infarcts within the thalami, basal ganglia and pons.  Progressive white matter type changes most likely related to result of small vessel disease  MRA HEAD  Findings: Mild narrowing petrous segment of the internal carotid artery  bilaterally greater on the right.  Ectasia of the distal vertical cervical segment of the internal carotid artery bilaterally greater on the right.  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  Stability can be confirmed on follow-up.  Bulbous appearance of the left middle cerebral artery bifurcation from which vessels arise.  Fetal type origin of the right posterior cerebral artery.  Middle cerebral artery branch vessel irregularity.  Smooth narrowing left vertebral artery after the takeoff of the left PICA.  Mild irregularity of the mid aspect of the basilar artery with associated mild narrowing.  Moderate narrowing P1 segments right posterior cerebral artery.  Mild narrowing posterior cerebral artery branch vessels.  IMPRESSION: Intracranial atherosclerotic type changes as detailed above.  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  Stability can be confirmed on follow-up.  Results discussed with Dr. Lynelle Doctor 02/11/2012 2:16 p.m.  Original Report Authenticated By: Fuller Canada, M.D.      Medications: I have reviewed the patient's current medications.  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 Workup for stroke in young is indicated  Plan check labs for vasculitis, hypercoagulable panel, 2-D echo, transesophageal echocardiogram, carotid Dopplers, urine drug screen and HIV. Discontinue aspirin and continue Plavix alone.  LOS: 1 day   Alysa Duca,PRAMODKUMAR P 02/12/2012, 9:49 AM

## 2012-02-12 NOTE — Progress Notes (Signed)
Subjective: Patient doing well today, no new c/o, wanting to go home.   Objective: Vital signs in last 24 hours: Filed Vitals:   02/12/12 0001 02/12/12 0200 02/12/12 0400 02/12/12 0600  BP: 142/79 145/88 130/82 145/86  Pulse: 64 73 67 68  Temp: 98.2 F (36.8 C) 97.6 F (36.4 C) 97.8 F (36.6 C) 97.7 F (36.5 C)  TempSrc: Oral Oral Oral Oral  Resp: 18 18 17 18   Height:      Weight:      SpO2: 98% 98% 100% 97%   Weight change:   Intake/Output Summary (Last 24 hours) at 02/12/12 1115 Last data filed at 02/11/12 2213  Gross per 24 hour  Intake      3 ml  Output      0 ml  Net      3 ml    Physical Exam: General: Awake, Oriented, No acute distress. HEENT: EOMI. Neck: Supple CV: S1 and S2 Lungs: Clear to ascultation bilaterally Abdomen: Soft, Nontender, Nondistended, +bowel sounds. Ext: Good pulses. Trace edema.   Lab Results:  Basename 02/11/12 1020 02/11/12 1012  NA 140 139  K 3.7 3.6  CL 103 104  CO2 -- 28  GLUCOSE 73 73  BUN 8 8  CREATININE 0.80 0.70  CALCIUM -- 9.6  MG -- --  PHOS -- --    Basename 02/11/12 1012  AST 15  ALT 17  ALKPHOS 84  BILITOT 0.5  PROT 7.5  ALBUMIN 4.2   No results found for this basename: LIPASE:2,AMYLASE:2 in the last 72 hours  Basename 02/11/12 1020 02/11/12 1012  WBC -- 6.3  NEUTROABS -- 4.2  HGB 16.0 14.9  HCT 47.0 43.5  MCV -- 85.6  PLT -- 251    Basename 02/11/12 1012  CKTOTAL --  CKMB --  CKMBINDEX --  TROPONINI <0.30   No components found with this basename: POCBNP:3 No results found for this basename: DDIMER:2 in the last 72 hours  Basename 02/11/12 1526  HGBA1C 5.7*    Basename 02/12/12 0535  CHOL 132  HDL 59  LDLCALC 53  TRIG 98  CHOLHDL 2.2  LDLDIRECT --   No results found for this basename: TSH,T4TOTAL,FREET3,T3FREE,THYROIDAB in the last 72 hours No results found for this basename: VITAMINB12:2,FOLATE:2,FERRITIN:2,TIBC:2,IRON:2,RETICCTPCT:2 in the last 72 hours  Micro Results: No  results found for this or any previous visit (from the past 240 hour(s)).  Studies/Results: Dg Chest 2 View  02/11/2012  *RADIOLOGY REPORT*  Clinical Data: Acute stroke.  CHEST - 2 VIEW  Comparison: None.  Findings: The heart size and pulmonary vascularity are normal and the lungs are clear.  No osseous abnormality.  IMPRESSION: Normal chest.  Original Report Authenticated By: Gwynn Burly, M.D.   Ct Head Wo Contrast  02/11/2012  *RADIOLOGY REPORT*  Clinical Data: Stroke symptoms.  Dizziness.  Lethargy.  CT HEAD WITHOUT CONTRAST  Technique:  Contiguous axial images were obtained from the base of the skull through the vertex without contrast.  Comparison: [facility] Radiology MRI from 03/14/2008.  Findings: There is no evidence for acute hemorrhage, hydrocephalus, mass lesion, or abnormal extra-axial fluid collection.  A small area of hypo attenuation is identified in the right frontal deep white matter.  No definite abnormality is seen at this location on the previous MRI.  The visualized paranasal sinuses and mastoid air cells are clear.  IMPRESSION: A small area of hypo attenuation identified in the posterior right frontal white matter could be an area of subacute ischemia.  MRI may prove helpful to further evaluate.  These test results were telephoned by me to Dr. Lynelle Doctor at the time of interpretation (1120 hours on 02/11/2012).  Original Report Authenticated By: ERIC A. MANSELL, M.D.   [name] Head Wo Contrast  02/11/2012  *RADIOLOGY REPORT*  Clinical Data:  Off balance.  Left leg and left arm draining. History of seizures.  MRI HEAD WITHOUT CONTRAST MRA HEAD WITHOUT CONTRAST  Technique: Multiplanar, multiecho pulse sequences of the brain and surrounding structures were obtained according to standard protocol without intravenous contrast.  Angiographic images of the head were obtained using MRA technique without contrast.  Comparison: 02/11/2012 CT.  03/14/2008 MR.  MRI HEAD  Findings:  Small acute non  hemorrhagic infarct posterior limb of the right internal capsule.  Remote infarcts within the thalami, basal ganglia and pons.  Progressive white matter type changes most likely related to result of small vessel disease given the surrounding findings.  Result of vasculitis not entirely excluded.  No hydrocephalus.  No intracranial mass lesion detected on this unenhanced exam.  No intracranial hemorrhage.  Mild degenerative changes C1-2 articulation.  Primary veins extending through parietal calvarium.  Appearance is unchanged.  IMPRESSION: Small acute non hemorrhagic infarct posterior limb of the right internal capsule.  Remote infarcts within the thalami, basal ganglia and pons.  Progressive white matter type changes most likely related to result of small vessel disease  MRA HEAD  Findings: Mild narrowing petrous segment of the internal carotid artery bilaterally greater on the right.  Ectasia of the distal vertical cervical segment of the internal carotid artery bilaterally greater on the right.  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  Stability can be confirmed on follow-up.  Bulbous appearance of the left middle cerebral artery bifurcation from which vessels arise.  Fetal type origin of the right posterior cerebral artery.  Middle cerebral artery branch vessel irregularity.  Smooth narrowing left vertebral artery after the takeoff of the left PICA.  Mild irregularity of the mid aspect of the basilar artery with associated mild narrowing.  Moderate narrowing P1 segments right posterior cerebral artery.  Mild narrowing posterior cerebral artery branch vessels.  IMPRESSION: Intracranial atherosclerotic type changes as detailed above.  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  Prominent infundibulum at the origin  of the left posterior communicating artery and left anterior choroidal artery.  Stability can be confirmed on follow-up.  Results discussed with Dr. Lynelle Doctor 02/11/2012 2:16 p.m.  Original Report Authenticated By: Fuller Canada, M.D.   Mr Brain Wo Contrast  02/11/2012  *RADIOLOGY REPORT*  Clinical Data:  Off balance.  Left leg and left arm draining. History of seizures.  MRI HEAD WITHOUT CONTRAST MRA HEAD WITHOUT CONTRAST  Technique: Multiplanar, multiecho pulse sequences of the brain and surrounding structures were obtained according to standard protocol without intravenous contrast.  Angiographic images of the head were obtained using MRA technique without contrast.  Comparison: 02/11/2012 CT.  03/14/2008 MR.  MRI HEAD  Findings:  Small acute non hemorrhagic infarct posterior limb of the right internal capsule.  Remote infarcts within the thalami, basal ganglia and pons.  Progressive white matter type changes most likely related to result of small vessel disease given the surrounding findings.  Result of vasculitis not entirely excluded.  No hydrocephalus.  No intracranial mass lesion detected on  this unenhanced exam.  No intracranial hemorrhage.  Mild degenerative changes C1-2 articulation.  Primary veins extending through parietal calvarium.  Appearance is unchanged.  IMPRESSION: Small acute non hemorrhagic infarct posterior limb of the right internal capsule.  Remote infarcts within the thalami, basal ganglia and pons.  Progressive white matter type changes most likely related to result of small vessel disease  MRA HEAD  Findings: Mild narrowing petrous segment of the internal carotid artery bilaterally greater on the right.  Ectasia of the distal vertical cervical segment of the internal carotid artery bilaterally greater on the right.  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  Prominent infundibulum at the origin of the left posterior communicating artery and left  anterior choroidal artery.  Stability can be confirmed on follow-up.  Bulbous appearance of the left middle cerebral artery bifurcation from which vessels arise.  Fetal type origin of the right posterior cerebral artery.  Middle cerebral artery branch vessel irregularity.  Smooth narrowing left vertebral artery after the takeoff of the left PICA.  Mild irregularity of the mid aspect of the basilar artery with associated mild narrowing.  Moderate narrowing P1 segments right posterior cerebral artery.  Mild narrowing posterior cerebral artery branch vessels.  IMPRESSION: Intracranial atherosclerotic type changes as detailed above.  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  Stability can be confirmed on follow-up.  Results discussed with Dr. Lynelle Doctor 02/11/2012 2:16 p.m.  Original Report Authenticated By: Fuller Canada, M.D.    Medications: I have reviewed the patient's current medications. Scheduled Meds:   . clopidogrel  75 mg Oral Q breakfast  . enoxaparin  40 mg Subcutaneous Q24H  . mulitivitamin with minerals  1 tablet Oral Daily  . sodium chloride  3 mL Intravenous Q12H  . DISCONTD: aspirin EC  81 mg Oral Daily   Continuous Infusions:  PRN Meds:.sodium chloride, acetaminophen, acetaminophen, hydrALAZINE, ondansetron (ZOFRAN) IV, senna-docusate, sodium chloride  Assessment/Plan:   *CVA (cerebral infarction)- stroke in young work up pending, neuro following, MRI +, 2D echo P will prob need TEE, plavix alone per neuro LDL: 53   HTN (hypertension)- monitor  PT eval pending Most likely will go home tomm after TEE      LOS: 1 day  Tamario Heal, DO 02/12/2012, 11:15 AM

## 2012-02-13 ENCOUNTER — Encounter (HOSPITAL_COMMUNITY)
Admission: EM | Disposition: A | Discharge status: Home / Self Care | Payer: Self-pay | Source: Ambulatory Visit | Attending: Internal Medicine

## 2012-02-13 DIAGNOSIS — M79609 Pain in unspecified limb: Secondary | ICD-10-CM

## 2012-02-13 DIAGNOSIS — I6789 Other cerebrovascular disease: Secondary | ICD-10-CM

## 2012-02-13 HISTORY — PX: TEE WITHOUT CARDIOVERSION: SHX5443

## 2012-02-13 LAB — URINE DRUGS OF ABUSE SCREEN W ALC, ROUTINE (REF LAB)
Benzodiazepines.: NEGATIVE
Cocaine Metabolites: NEGATIVE
Opiate Screen, Urine: NEGATIVE
Propoxyphene: NEGATIVE

## 2012-02-13 LAB — PROTEIN S, TOTAL: Protein S Ag, Total: 96 % (ref 60–150)

## 2012-02-13 LAB — LUPUS ANTICOAGULANT PANEL: DRVVT: 38.4 secs (ref 34.1–42.2)

## 2012-02-13 LAB — PROTEIN S ACTIVITY: Protein S Activity: 103 % (ref 69–129)

## 2012-02-13 SURGERY — ECHOCARDIOGRAM, TRANSESOPHAGEAL
Anesthesia: Moderate Sedation

## 2012-02-13 MED ORDER — MIDAZOLAM HCL 10 MG/2ML IJ SOLN
INTRAMUSCULAR | Status: DC | PRN
  Administered 2012-02-13: 1 mg via INTRAVENOUS
  Administered 2012-02-13: 2 mg via INTRAVENOUS

## 2012-02-13 MED ORDER — MIDAZOLAM HCL 10 MG/2ML IJ SOLN
INTRAMUSCULAR | Status: AC
  Filled 2012-02-13: qty 2

## 2012-02-13 MED ORDER — FENTANYL CITRATE 0.05 MG/ML IJ SOLN
INTRAMUSCULAR | Status: AC
  Filled 2012-02-13: qty 2

## 2012-02-13 MED ORDER — AMLODIPINE BESYLATE 5 MG PO TABS
5.0000 mg | ORAL_TABLET | Freq: Every day | ORAL | Status: DC
  Administered 2012-02-13 (×3): 5 mg via ORAL
  Filled 2012-02-13: qty 1

## 2012-02-13 MED ORDER — CLOPIDOGREL BISULFATE 75 MG PO TABS: 75.0000 mg | ORAL_TABLET | Freq: Every day | ORAL | Status: AC

## 2012-02-13 MED ORDER — BUTAMBEN-TETRACAINE-BENZOCAINE 2-2-14 % EX AERO
INHALATION_SPRAY | CUTANEOUS | Status: DC | PRN
  Administered 2012-02-13: 2 via TOPICAL

## 2012-02-13 MED ORDER — FENTANYL CITRATE 0.05 MG/ML IJ SOLN
INTRAMUSCULAR | Status: DC | PRN
  Administered 2012-02-13: 50 ug via INTRAVENOUS
  Administered 2012-02-13: 25 ug via INTRAVENOUS

## 2012-02-13 MED ORDER — AMLODIPINE BESYLATE 5 MG PO TABS: 5.0000 mg | ORAL_TABLET | Freq: Every day | ORAL | Status: DC

## 2012-02-13 MED ORDER — DIPHENHYDRAMINE HCL 50 MG/ML IJ SOLN
INTRAMUSCULAR | Status: AC
  Filled 2012-02-13: qty 1

## 2012-02-13 NOTE — Progress Notes (Signed)
VASCULAR LAB PRELIMINARY  PRELIMINARY  PRELIMINARY  PRELIMINARY  Bilateral lower extremity venous duplex has been completed.    Preliminary report:  Bilateral:  Negative for deep and superficial; vein thrombosis.  Rodney Page, 02/13/2012, 3:48 PM

## 2012-02-13 NOTE — H&P (View-Only) (Signed)
Subjective: Patient doing well today, no new c/o, wanting to go home. Still with weakness in L side   Objective: Vital signs in last 24 hours: Filed Vitals:   02/12/12 1747 02/12/12 2200 02/13/12 0200 02/13/12 0600  BP: 164/99 154/88 168/90 155/78  Pulse: 70 62 58 68  Temp: 97.9 F (36.6 C) 97.7 F (36.5 C) 97.6 F (36.4 C) 97.6 F (36.4 C)  TempSrc: Oral     Resp: 18 16 16 16  Height:      Weight:      SpO2: 97% 95% 100% 100%   Weight change:   Intake/Output Summary (Last 24 hours) at 02/13/12 0841 Last data filed at 02/12/12 2330  Gross per 24 hour  Intake    403 ml  Output      0 ml  Net    403 ml    Physical Exam: General: Awake, Oriented, No acute distress. HEENT: EOMI. Neck: Supple CV: S1 and S2 Lungs: Clear to ascultation bilaterally Abdomen: Soft, Nontender, Nondistended, +bowel sounds. Ext: Good pulses. Trace edema.   Lab Results:  Basename 02/11/12 1020 02/11/12 1012  NA 140 139  K 3.7 3.6  CL 103 104  CO2 -- 28  GLUCOSE 73 73  BUN 8 8  CREATININE 0.80 0.70  CALCIUM -- 9.6  MG -- --  PHOS -- --    Basename 02/11/12 1012  AST 15  ALT 17  ALKPHOS 84  BILITOT 0.5  PROT 7.5  ALBUMIN 4.2   No results found for this basename: LIPASE:2,AMYLASE:2 in the last 72 hours  Basename 02/11/12 1020 02/11/12 1012  WBC -- 6.3  NEUTROABS -- 4.2  HGB 16.0 14.9  HCT 47.0 43.5  MCV -- 85.6  PLT -- 251    Basename 02/11/12 1012  CKTOTAL --  CKMB --  CKMBINDEX --  TROPONINI <0.30   No components found with this basename: POCBNP:3 No results found for this basename: DDIMER:2 in the last 72 hours  Basename 02/12/12 0535 02/11/12 1526  HGBA1C 5.7* 5.7*    Basename 02/12/12 0535  CHOL 132  HDL 59  LDLCALC 53  TRIG 98  CHOLHDL 2.2  LDLDIRECT --   No results found for this basename: TSH,T4TOTAL,FREET3,T3FREE,THYROIDAB in the last 72 hours No results found for this basename: VITAMINB12:2,FOLATE:2,FERRITIN:2,TIBC:2,IRON:2,RETICCTPCT:2 in the  last 72 hours  Micro Results: No results found for this or any previous visit (from the past 240 hour(s)).  Studies/Results: Dg Chest 2 View  02/11/2012  *RADIOLOGY REPORT*  Clinical Data: Acute stroke.  CHEST - 2 VIEW  Comparison: None.  Findings: The heart size and pulmonary vascularity are normal and the lungs are clear.  No osseous abnormality.  IMPRESSION: Normal chest.  Original Report Authenticated By: JAMES H. MAXWELL, M.D.   Ct Head Wo Contrast  02/11/2012  *RADIOLOGY REPORT*  Clinical Data: Stroke symptoms.  Dizziness.  Lethargy.  CT HEAD WITHOUT CONTRAST  Technique:  Contiguous axial images were obtained from the base of the skull through the vertex without contrast.  Comparison: [facility] MRI from 03/14/2008.  Findings: There is no evidence for acute hemorrhage, hydrocephalus, mass lesion, or abnormal extra-axial fluid collection.  A small area of hypo attenuation is identified in the right frontal deep white matter.  No definite abnormality is seen at this location on the previous MRI.  The visualized paranasal sinuses and mastoid air cells are clear.  IMPRESSION: A small area of hypo attenuation identified in the posterior right frontal white matter could be an   area of subacute ischemia.  MRI may prove helpful to further evaluate.  These test results were telephoned by me to Dr. Knapp at the time of interpretation (1120 hours on 02/11/2012).  Original Report Authenticated By: ERIC A. MANSELL, M.D.   Mr Mra Head Wo Contrast  02/11/2012  *RADIOLOGY REPORT*  Clinical Data:  Off balance.  Left leg and left arm draining. History of seizures.  MRI HEAD WITHOUT CONTRAST MRA HEAD WITHOUT CONTRAST  Technique: Multiplanar, multiecho pulse sequences of the brain and surrounding structures were obtained according to standard protocol without intravenous contrast.  Angiographic images of the head were obtained using MRA technique without contrast.  Comparison: 02/11/2012 CT.  03/14/2008 MR.   MRI HEAD  Findings:  Small acute non hemorrhagic infarct posterior limb of the right internal capsule.  Remote infarcts within the thalami, basal ganglia and pons.  Progressive white matter type changes most likely related to result of small vessel disease given the surrounding findings.  Result of vasculitis not entirely excluded.  No hydrocephalus.  No intracranial mass lesion detected on this unenhanced exam.  No intracranial hemorrhage.  Mild degenerative changes C1-2 articulation.  Primary veins extending through parietal calvarium.  Appearance is unchanged.  IMPRESSION: Small acute non hemorrhagic infarct posterior limb of the right internal capsule.  Remote infarcts within the thalami, basal ganglia and pons.  Progressive white matter type changes most likely related to result of small vessel disease  MRA HEAD  Findings: Mild narrowing petrous segment of the internal carotid artery bilaterally greater on the right.  Ectasia of the distal vertical cervical segment of the internal carotid artery bilaterally greater on the right.  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  Stability can be confirmed on follow-up.  Bulbous appearance of the left middle cerebral artery bifurcation from which vessels arise.  Fetal type origin of the right posterior cerebral artery.  Middle cerebral artery branch vessel irregularity.  Smooth narrowing left vertebral artery after the takeoff of the left PICA.  Mild irregularity of the mid aspect of the basilar artery with associated mild narrowing.  Moderate narrowing P1 segments right posterior cerebral artery.  Mild narrowing posterior cerebral artery branch vessels.  IMPRESSION: Intracranial atherosclerotic type changes as detailed above.  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  Stability can be confirmed on follow-up.  Results discussed with Dr. Knapp 02/11/2012 2:16 p.m.  Original Report Authenticated By: STEVEN R. OLSON, M.D.   Mr Brain Wo Contrast  02/11/2012  *RADIOLOGY REPORT*  Clinical Data:  Off balance.  Left leg and left arm draining. History of seizures.  MRI HEAD WITHOUT CONTRAST MRA HEAD WITHOUT CONTRAST  Technique: Multiplanar, multiecho pulse sequences of the brain and surrounding structures were obtained according to standard protocol without intravenous contrast.  Angiographic images of the head were obtained using MRA technique without contrast.  Comparison: 02/11/2012 CT.  03/14/2008 MR.  MRI HEAD  Findings:  Small acute non hemorrhagic infarct posterior limb of the right internal capsule.  Remote infarcts within the thalami, basal ganglia and pons.  Progressive white matter type changes most likely related to result of small vessel disease given the surrounding findings.  Result of vasculitis not entirely excluded.  No hydrocephalus.  No   intracranial mass lesion detected on this unenhanced exam.  No intracranial hemorrhage.  Mild degenerative changes C1-2 articulation.  Primary veins extending through parietal calvarium.  Appearance is unchanged.  IMPRESSION: Small acute non hemorrhagic infarct posterior limb of the right internal capsule.  Remote infarcts within the thalami, basal ganglia and pons.  Progressive white matter type changes most likely related to result of small vessel disease  MRA HEAD  Findings: Mild narrowing petrous segment of the internal carotid artery bilaterally greater on the right.  Ectasia of the distal vertical cervical segment of the internal carotid artery bilaterally greater on the right.  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  Prominent infundibulum at the origin of the left  posterior communicating artery and left anterior choroidal artery.  Stability can be confirmed on follow-up.  Bulbous appearance of the left middle cerebral artery bifurcation from which vessels arise.  Fetal type origin of the right posterior cerebral artery.  Middle cerebral artery branch vessel irregularity.  Smooth narrowing left vertebral artery after the takeoff of the left PICA.  Mild irregularity of the mid aspect of the basilar artery with associated mild narrowing.  Moderate narrowing P1 segments right posterior cerebral artery.  Mild narrowing posterior cerebral artery branch vessels.  IMPRESSION: Intracranial atherosclerotic type changes as detailed above.  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  Stability can be confirmed on follow-up.  Results discussed with Dr. Knapp 02/11/2012 2:16 p.m.  Original Report Authenticated By: STEVEN R. OLSON, M.D.    Medications: I have reviewed the patient's current medications. Scheduled Meds:    . clopidogrel  75 mg Oral Q breakfast  . enoxaparin  40 mg Subcutaneous Q24H  . mulitivitamin with minerals  1 tablet Oral Daily  . sodium chloride  3 mL Intravenous Q12H  . DISCONTD: aspirin EC  81 mg Oral Daily   Continuous Infusions:  PRN Meds:.sodium chloride, acetaminophen, acetaminophen, hydrALAZINE, ondansetron (ZOFRAN) IV, senna-docusate, sodium chloride  Assessment/Plan:   *CVA (cerebral infarction)- stroke in young work up pending, neuro following, MRI +, 2D echo ok but will need TEE today ([facility]), plavix alone per neuro LDL: 53   HTN (hypertension)- monitor  PT eval- ok- out patient PT Most likely will go homeafter TEE      LOS: 2 days  Chellsea Beckers, DO 02/13/2012, 8:41 AM 

## 2012-02-13 NOTE — Progress Notes (Signed)
Subjective: Patient doing well today, no new c/o, wanting to go home. Still with weakness in L side   Objective: Vital signs in last 24 hours: Filed Vitals:   02/12/12 1747 02/12/12 2200 02/13/12 0200 02/13/12 0600  BP: 164/99 154/88 168/90 155/78  Pulse: 70 62 58 68  Temp: 97.9 F (36.6 C) 97.7 F (36.5 C) 97.6 F (36.4 C) 97.6 F (36.4 C)  TempSrc: Oral     Resp: 18 16 16 16   Height:      Weight:      SpO2: 97% 95% 100% 100%   Weight change:   Intake/Output Summary (Last 24 hours) at 02/13/12 0841 Last data filed at 02/12/12 2330  Gross per 24 hour  Intake    403 ml  Output      0 ml  Net    403 ml    Physical Exam: General: Awake, Oriented, No acute distress. HEENT: EOMI. Neck: Supple CV: S1 and S2 Lungs: Clear to ascultation bilaterally Abdomen: Soft, Nontender, Nondistended, +bowel sounds. Ext: Good pulses. Trace edema.   Lab Results:  Basename 02/11/12 1020 02/11/12 1012  NA 140 139  K 3.7 3.6  CL 103 104  CO2 -- 28  GLUCOSE 73 73  BUN 8 8  CREATININE 0.80 0.70  CALCIUM -- 9.6  MG -- --  PHOS -- --    Basename 02/11/12 1012  AST 15  ALT 17  ALKPHOS 84  BILITOT 0.5  PROT 7.5  ALBUMIN 4.2   No results found for this basename: LIPASE:2,AMYLASE:2 in the last 72 hours  Basename 02/11/12 1020 02/11/12 1012  WBC -- 6.3  NEUTROABS -- 4.2  HGB 16.0 14.9  HCT 47.0 43.5  MCV -- 85.6  PLT -- 251    Basename 02/11/12 1012  CKTOTAL --  CKMB --  CKMBINDEX --  TROPONINI <0.30   No components found with this basename: POCBNP:3 No results found for this basename: DDIMER:2 in the last 72 hours  Basename 02/12/12 0535 02/11/12 1526  HGBA1C 5.7* 5.7*    Basename 02/12/12 0535  CHOL 132  HDL 59  LDLCALC 53  TRIG 98  CHOLHDL 2.2  LDLDIRECT --   No results found for this basename: TSH,T4TOTAL,FREET3,T3FREE,THYROIDAB in the last 72 hours No results found for this basename: VITAMINB12:2,FOLATE:2,FERRITIN:2,TIBC:2,IRON:2,RETICCTPCT:2 in the  last 72 hours  Micro Results: No results found for this or any previous visit (from the past 240 hour(s)).  Studies/Results: Dg Chest 2 View  02/11/2012  *RADIOLOGY REPORT*  Clinical Data: Acute stroke.  CHEST - 2 VIEW  Comparison: None.  Findings: The heart size and pulmonary vascularity are normal and the lungs are clear.  No osseous abnormality.  IMPRESSION: Normal chest.  Original Report Authenticated By: Gwynn Burly, M.D.   Ct Head Wo Contrast  02/11/2012  *RADIOLOGY REPORT*  Clinical Data: Stroke symptoms.  Dizziness.  Lethargy.  CT HEAD WITHOUT CONTRAST  Technique:  Contiguous axial images were obtained from the base of the skull through the vertex without contrast.  Comparison: [facility] MRI from 03/14/2008.  Findings: There is no evidence for acute hemorrhage, hydrocephalus, mass lesion, or abnormal extra-axial fluid collection.  A small area of hypo attenuation is identified in the right frontal deep white matter.  No definite abnormality is seen at this location on the previous MRI.  The visualized paranasal sinuses and mastoid air cells are clear.  IMPRESSION: A small area of hypo attenuation identified in the posterior right frontal white matter could be an  area of subacute ischemia.  MRI may prove helpful to further evaluate.  These test results were telephoned by me to Dr. Lynelle Doctor at the time of interpretation (1120 hours on 02/11/2012).  Original Report Authenticated By: ERIC A. MANSELL, M.D.   [name] Head Wo Contrast  02/11/2012  *RADIOLOGY REPORT*  Clinical Data:  Off balance.  Left leg and left arm draining. History of seizures.  MRI HEAD WITHOUT CONTRAST MRA HEAD WITHOUT CONTRAST  Technique: Multiplanar, multiecho pulse sequences of the brain and surrounding structures were obtained according to standard protocol without intravenous contrast.  Angiographic images of the head were obtained using MRA technique without contrast.  Comparison: 02/11/2012 CT.  03/14/2008 MR.   MRI HEAD  Findings:  Small acute non hemorrhagic infarct posterior limb of the right internal capsule.  Remote infarcts within the thalami, basal ganglia and pons.  Progressive white matter type changes most likely related to result of small vessel disease given the surrounding findings.  Result of vasculitis not entirely excluded.  No hydrocephalus.  No intracranial mass lesion detected on this unenhanced exam.  No intracranial hemorrhage.  Mild degenerative changes C1-2 articulation.  Primary veins extending through parietal calvarium.  Appearance is unchanged.  IMPRESSION: Small acute non hemorrhagic infarct posterior limb of the right internal capsule.  Remote infarcts within the thalami, basal ganglia and pons.  Progressive white matter type changes most likely related to result of small vessel disease  MRA HEAD  Findings: Mild narrowing petrous segment of the internal carotid artery bilaterally greater on the right.  Ectasia of the distal vertical cervical segment of the internal carotid artery bilaterally greater on the right.  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  Stability can be confirmed on follow-up.  Bulbous appearance of the left middle cerebral artery bifurcation from which vessels arise.  Fetal type origin of the right posterior cerebral artery.  Middle cerebral artery branch vessel irregularity.  Smooth narrowing left vertebral artery after the takeoff of the left PICA.  Mild irregularity of the mid aspect of the basilar artery with associated mild narrowing.  Moderate narrowing P1 segments right posterior cerebral artery.  Mild narrowing posterior cerebral artery branch vessels.  IMPRESSION: Intracranial atherosclerotic type changes as detailed above.  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  Stability can be confirmed on follow-up.  Results discussed with Dr. Lynelle Doctor 02/11/2012 2:16 p.m.  Original Report Authenticated By: Fuller Canada, M.D.   Mr Brain Wo Contrast  02/11/2012  *RADIOLOGY REPORT*  Clinical Data:  Off balance.  Left leg and left arm draining. History of seizures.  MRI HEAD WITHOUT CONTRAST MRA HEAD WITHOUT CONTRAST  Technique: Multiplanar, multiecho pulse sequences of the brain and surrounding structures were obtained according to standard protocol without intravenous contrast.  Angiographic images of the head were obtained using MRA technique without contrast.  Comparison: 02/11/2012 CT.  03/14/2008 MR.  MRI HEAD  Findings:  Small acute non hemorrhagic infarct posterior limb of the right internal capsule.  Remote infarcts within the thalami, basal ganglia and pons.  Progressive white matter type changes most likely related to result of small vessel disease given the surrounding findings.  Result of vasculitis not entirely excluded.  No hydrocephalus.  No  intracranial mass lesion detected on this unenhanced exam.  No intracranial hemorrhage.  Mild degenerative changes C1-2 articulation.  Primary veins extending through parietal calvarium.  Appearance is unchanged.  IMPRESSION: Small acute non hemorrhagic infarct posterior limb of the right internal capsule.  Remote infarcts within the thalami, basal ganglia and pons.  Progressive white matter type changes most likely related to result of small vessel disease  MRA HEAD  Findings: Mild narrowing petrous segment of the internal carotid artery bilaterally greater on the right.  Ectasia of the distal vertical cervical segment of the internal carotid artery bilaterally greater on the right.  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  Prominent infundibulum at the origin of the left  posterior communicating artery and left anterior choroidal artery.  Stability can be confirmed on follow-up.  Bulbous appearance of the left middle cerebral artery bifurcation from which vessels arise.  Fetal type origin of the right posterior cerebral artery.  Middle cerebral artery branch vessel irregularity.  Smooth narrowing left vertebral artery after the takeoff of the left PICA.  Mild irregularity of the mid aspect of the basilar artery with associated mild narrowing.  Moderate narrowing P1 segments right posterior cerebral artery.  Mild narrowing posterior cerebral artery branch vessels.  IMPRESSION: Intracranial atherosclerotic type changes as detailed above.  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  Stability can be confirmed on follow-up.  Results discussed with Dr. Lynelle Doctor 02/11/2012 2:16 p.m.  Original Report Authenticated By: Fuller Canada, M.D.    Medications: I have reviewed the patient's current medications. Scheduled Meds:    . clopidogrel  75 mg Oral Q breakfast  . enoxaparin  40 mg Subcutaneous Q24H  . mulitivitamin with minerals  1 tablet Oral Daily  . sodium chloride  3 mL Intravenous Q12H  . DISCONTD: aspirin EC  81 mg Oral Daily   Continuous Infusions:  PRN Meds:.sodium chloride, acetaminophen, acetaminophen, hydrALAZINE, ondansetron (ZOFRAN) IV, senna-docusate, sodium chloride  Assessment/Plan:   *CVA (cerebral infarction)- stroke in young work up pending, neuro following, MRI +, 2D echo ok but will need TEE today ([facility]), plavix alone per neuro LDL: 53   HTN (hypertension)- monitor  PT eval- ok- out patient PT Most likely will go homeafter TEE      LOS: 2 days  Helon Wisinski, DO 02/13/2012, 8:41 AM

## 2012-02-13 NOTE — Discharge Summary (Signed)
Discharge Summary  VARDAAN Page MR#: [id number]  DOB:[birth year]  Date of Admission: 02/11/2012 Date of Discharge: 02/13/2012  Patient's PCP: Dr. Clarene Duke  Attending Physician:Panayiota Larkin  Consults: Treatment Team:  Md Stroke, MD   Discharge Diagnoses: Principal Problem:  *CVA (cerebral infarction) Active Problems:  HTN (hypertension)   Brief Admitting History and Physical Rodney Page is a 51 y.o. african [place] male who woke up yesterday morning and noticed that his balance felt off. He felt like he was falling to the left. He felt fatigued during the day. He also felt like his left leg and left arm were dragging. He did not notice any trouble with his speech however his family members and coworkers thought it seemed different. The patient has a history of a possible stroke back in 2009. He has history of seizures but has not had any in many years. The symptoms have persisted. He still feels like he is falling a little bit to the left side.  No CP,no SOB   Discharge Medications Medication List  As of 02/13/2012  5:02 PM   STOP taking these medications         aspirin EC 81 MG tablet         TAKE these medications         amLODipine 5 MG tablet   Commonly known as: NORVASC   Take 1 tablet (5 mg total) by mouth daily.      clopidogrel 75 MG tablet   Commonly known as: PLAVIX   Take 1 tablet (75 mg total) by mouth daily with breakfast.      mulitivitamin with minerals Tabs   Take 1 tablet by mouth daily.      OVER THE COUNTER MEDICATION   Take 1 tablet by mouth daily. Medication: over the counter allergy medication      OVER THE COUNTER MEDICATION   Take 1 tablet by mouth daily. Medicaion: an additional "centrum" supplement that works with his multivitamin.  Unsure of name.      VITAMIN B-12 CR PO   Take 1 tablet by mouth daily.            Hospital Course: CVA (cerebral infarction)- MRI +, seen by neurology who recommended plavix, TEE showed PFO-  will follow up outpatient with neurology for further management.  Low LDL, outpatient PT/OT  .HTN (hypertension) - norvasc, check BP at home with BP cuff and bring to PCP  PFO- follow up outpatient with neurology for ? Closure or intervention   Day of Discharge BP 142/86  Pulse 73  Temp(Src) 98.2 F (36.8 C) (Oral)  Resp 14  Ht 6' (1.829 m)  Wt 68.9 kg (151 lb 14.4 oz)  BMI 20.60 kg/m2  SpO2 100%  Results for orders placed during the hospital encounter of 02/11/12 (from the past 48 hour(s))  HEMOGLOBIN A1C     Status: Abnormal   Collection Time   02/12/12  5:35 AM      Component Value Range Comment   Hemoglobin A1C 5.7 (*) <5.7 (%)    Mean Plasma Glucose 117 (*) <117 (mg/dL)   LIPID PANEL     Status: Normal   Collection Time   02/12/12  5:35 AM      Component Value Range Comment   Cholesterol 132  0 - 200 (mg/dL)    Triglycerides 98  <629 (mg/dL)    HDL 59  >52 (mg/dL)    Total CHOL/HDL Ratio 2.2      VLDL  20  0 - 40 (mg/dL)    LDL Cholesterol 53  0 - 99 (mg/dL)   ANTITHROMBIN III     Status: Normal   Collection Time   02/12/12 10:11 AM      Component Value Range Comment   AntiThromb III Func 114  75 - 120 (%)   PROTEIN C ACTIVITY     Status: Abnormal   Collection Time   02/12/12 10:11 AM      Component Value Range Comment   Protein C Activity 167 (*) 75 - 133 (%)   PROTEIN C, TOTAL     Status: Normal   Collection Time   02/12/12 10:11 AM      Component Value Range Comment   Protein C, Total 95  72 - 160 (%)   PROTEIN S ACTIVITY     Status: Normal   Collection Time   02/12/12 10:11 AM      Component Value Range Comment   Protein S Activity 103  69 - 129 (%)   PROTEIN S, TOTAL     Status: Normal   Collection Time   02/12/12 10:11 AM      Component Value Range Comment   Protein S Ag, Total 96  60 - 150 (%)   LUPUS ANTICOAGULANT PANEL     Status: Normal   Collection Time   02/12/12 10:11 AM      Component Value Range Comment   PTT Lupus Anticoagulant 38.7  28.0 - 43.0  (secs)    PTTLA Confirmation NOT APPL  <8.0 (secs)    PTTLA 4:1 Mix NOT APPL  28.0 - 43.0 (secs)    Drvvt 38.4  34.1 - 42.2 (secs)    Drvvt confirmation NOT APPL  <1.16 (Ratio)    dRVVT Incubated 1:1 Mix NOT APPL  34.1 - 42.2 (secs)    Lupus Anticoagulant NOT DETECTED  NOT DETECTED    BETA-2-GLYCOPROTEIN I ABS, IGG/M/A     Status: Normal   Collection Time   02/12/12 10:11 AM      Component Value Range Comment   Beta-2 Glyco I IgG 2  <20 (G Units)    Beta-2-Glycoprotein I IgM 5  <20 (M Units)    Beta-2-Glycoprotein I IgA 3  <20 (A Units)   HOMOCYSTEINE, SERUM     Status: Normal   Collection Time   02/12/12 10:11 AM      Component Value Range Comment   Homocysteine-Norm 6.5  4.0 - 15.4 (umol/L)   ANA     Status: Normal   Collection Time   02/12/12 10:11 AM      Component Value Range Comment   ANA NEGATIVE  NEGATIVE    HIV ANTIBODY (ROUTINE TESTING)     Status: Normal   Collection Time   02/12/12 10:11 AM      Component Value Range Comment   HIV NON REACTIVE  NON REACTIVE    DRUGS OF ABUSE SCREEN W ALC, ROUTINE URINE     Status: Normal   Collection Time   02/12/12  4:25 PM      Component Value Range Comment   Amphetamine Screen, Ur NEGATIVE  Negative     Marijuana Metabolite NEGATIVE  Negative     Barbiturate Quant, Ur NEGATIVE  Negative     Methadone NEGATIVE  Negative     Propoxyphene NEGATIVE  Negative     Benzodiazepines. NEGATIVE  Negative     Phencyclidine (PCP) NEGATIVE  Negative     Cocaine Metabolites  NEGATIVE  Negative     Opiate Screen, Urine NEGATIVE  Negative     Ethyl Alcohol <10  <10 (mg/dL)    Creatinine,U 409.7       Dg Chest 2 View  02/11/2012  *RADIOLOGY REPORT*  Clinical Data: Acute stroke.  CHEST - 2 VIEW  Comparison: None.  Findings: The heart size and pulmonary vascularity are normal and the lungs are clear.  No osseous abnormality.  IMPRESSION: Normal chest.  Original Report Authenticated By: Gwynn Burly, M.D.   Ct Head Wo Contrast  02/11/2012   *RADIOLOGY REPORT*  Clinical Data: Stroke symptoms.  Dizziness.  Lethargy.  CT HEAD WITHOUT CONTRAST  Technique:  Contiguous axial images were obtained from the base of the skull through the vertex without contrast.  Comparison: [facility] MRI from 03/14/2008.  Findings: There is no evidence for acute hemorrhage, hydrocephalus, mass lesion, or abnormal extra-axial fluid collection.  A small area of hypo attenuation is identified in the right frontal deep white matter.  No definite abnormality is seen at this location on the previous MRI.  The visualized paranasal sinuses and mastoid air cells are clear.  IMPRESSION: A small area of hypo attenuation identified in the posterior right frontal white matter could be an area of subacute ischemia.  MRI may prove helpful to further evaluate.  These test results were telephoned by me to Dr. Lynelle Doctor at the time of interpretation (1120 hours on 02/11/2012).  Original Report Authenticated By: ERIC A. MANSELL, M.D.   [name] Head Wo Contrast  02/11/2012  *RADIOLOGY REPORT*  Clinical Data:  Off balance.  Left leg and left arm draining. History of seizures.  MRI HEAD WITHOUT CONTRAST MRA HEAD WITHOUT CONTRAST  Technique: Multiplanar, multiecho pulse sequences of the brain and surrounding structures were obtained according to standard protocol without intravenous contrast.  Angiographic images of the head were obtained using MRA technique without contrast.  Comparison: 02/11/2012 CT.  03/14/2008 MR.  MRI HEAD  Findings:  Small acute non hemorrhagic infarct posterior limb of the right internal capsule.  Remote infarcts within the thalami, basal ganglia and pons.  Progressive white matter type changes most likely related to result of small vessel disease given the surrounding findings.  Result of vasculitis not entirely excluded.  No hydrocephalus.  No intracranial mass lesion detected on this unenhanced exam.  No intracranial hemorrhage.  Mild degenerative changes C1-2  articulation.  Primary veins extending through parietal calvarium.  Appearance is unchanged.  IMPRESSION: Small acute non hemorrhagic infarct posterior limb of the right internal capsule.  Remote infarcts within the thalami, basal ganglia and pons.  Progressive white matter type changes most likely related to result of small vessel disease  MRA HEAD  Findings: Mild narrowing petrous segment of the internal carotid artery bilaterally greater on the right.  Ectasia of the distal vertical cervical segment of the internal carotid artery bilaterally greater on the right.  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  Stability can be confirmed on follow-up.  Bulbous appearance of the left middle cerebral artery bifurcation from which vessels arise.  Fetal type origin of the right posterior cerebral artery.  Middle cerebral artery branch vessel irregularity.  Smooth narrowing left vertebral artery after the takeoff of the left PICA.  Mild irregularity of the mid aspect of the basilar artery with associated mild narrowing.  Moderate narrowing  P1 segments right posterior cerebral artery.  Mild narrowing posterior cerebral artery branch vessels.  IMPRESSION: Intracranial atherosclerotic type changes as detailed above.  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  Stability can be confirmed on follow-up.  Results discussed with Dr. Lynelle Doctor 02/11/2012 2:16 p.m.  Original Report Authenticated By: Fuller Canada, M.D.   Mr Brain Wo Contrast  02/11/2012  *RADIOLOGY REPORT*  Clinical Data:  Off balance.  Left leg and left arm draining. History of seizures.  MRI HEAD WITHOUT CONTRAST MRA HEAD WITHOUT CONTRAST  Technique:  Multiplanar, multiecho pulse sequences of the brain and surrounding structures were obtained according to standard protocol without intravenous contrast.  Angiographic images of the head were obtained using MRA technique without contrast.  Comparison: 02/11/2012 CT.  03/14/2008 MR.  MRI HEAD  Findings:  Small acute non hemorrhagic infarct posterior limb of the right internal capsule.  Remote infarcts within the thalami, basal ganglia and pons.  Progressive white matter type changes most likely related to result of small vessel disease given the surrounding findings.  Result of vasculitis not entirely excluded.  No hydrocephalus.  No intracranial mass lesion detected on this unenhanced exam.  No intracranial hemorrhage.  Mild degenerative changes C1-2 articulation.  Primary veins extending through parietal calvarium.  Appearance is unchanged.  IMPRESSION: Small acute non hemorrhagic infarct posterior limb of the right internal capsule.  Remote infarcts within the thalami, basal ganglia and pons.  Progressive white matter type changes most likely related to result of small vessel disease  MRA HEAD  Findings: Mild narrowing petrous segment of the internal carotid artery bilaterally greater on the right.  Ectasia of the distal vertical cervical segment of the internal carotid artery bilaterally greater on the right.  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  Stability can be confirmed on follow-up.  Bulbous appearance of the left middle cerebral artery bifurcation from which vessels arise.  Fetal type origin of the right posterior cerebral artery.  Middle cerebral artery branch vessel irregularity.  Smooth narrowing left vertebral artery after the takeoff of the left PICA.  Mild irregularity of the mid aspect of the basilar artery with associated mild narrowing.   Moderate narrowing P1 segments right posterior cerebral artery.  Mild narrowing posterior cerebral artery branch vessels.  IMPRESSION: Intracranial atherosclerotic type changes as detailed above.  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  Stability can be confirmed on follow-up.  Results discussed with Dr. Lynelle Doctor 02/11/2012 2:16 p.m.  Original Report Authenticated By: Fuller Canada, M.D.     Disposition: home with outpatient PT/OT  Diet: cardiac  Activity: as tolerated No driving  Follow-up Appts: Discharge Orders    Future Orders Please Complete By Expires   Diet - low sodium heart healthy      Increase activity slowly      Discharge instructions      Comments:   Outpatient physical and occupation therapy Follow up with Dr. Pearlean Brownie for results of stoke work up in young and bubble study May return to work on April 1st.       Time spent  on discharge, talking to the patient, and coordinating care: 40 mins.   SignedMarlin Canary, DO 02/13/2012, 5:02 PM

## 2012-02-13 NOTE — Op Note (Signed)
    Transesophageal Echocardiogram Note  Rodney Page [id number] [birth year]  Procedure: Transesophageal Echocardiogram Indications: CVA  Procedure Details Consent: Obtained Time Out: Verified patient identification, verified procedure, site/side was marked, verified correct patient position, special equipment/implants available, Radiology Safety Procedures followed,  medications/allergies/relevent history reviewed, required imaging and test results available.  Performed  Medications: Fentanyl: 75 mcg IV Versed: 3 mg IV  Left Ventrical:  Normal LV function  Mitral Valve: normal MV, trivial MR  Aortic Valve: Normal 3 leaflet AV  Tricuspid Valve: normal TV  Pulmonic Valve: normal  Left Atrium/ Left atrial appendage: normal, no thrombi  Atrial septum: + PFO by bubble study with right to left shunting  Aorta: normal    Complications: No apparent complications Patient did tolerate procedure well.   Vesta Mixer, Montez Hageman., MD, [facility] 02/13/2012, 12:16 PM

## 2012-02-13 NOTE — Discharge Instructions (Signed)
STROKE/TIA DISCHARGE INSTRUCTIONS SMOKING Cigarette smoking nearly doubles your risk of having a stroke & is the single most alterable risk factor  If you smoke or have smoked in the last 12 months, you are advised to quit smoking for your health.  Most of the excess cardiovascular risk related to smoking disappears within a year of stopping.  Ask you doctor about anti-smoking medications  [place] Quit Line: 1-800-QUIT NOW  Free Smoking Cessation Classes ([phone]  CHOLESTEROL Know your levels; limit fat & cholesterol in your diet  Lipid Panel  No results found for this basename: chol, trig, hdl, cholhdl, vldl, ldlcalc      Many patients benefit from treatment even if their cholesterol is at goal.  Goal: Total Cholesterol (CHOL) less than 160  Goal:  Triglycerides (TRIG) less than 150  Goal:  HDL greater than 40  Goal:  LDL (LDLCALC) less than 100   BLOOD PRESSURE American Stroke Association blood pressure target is less that 120/80 mm/Hg  Your discharge blood pressure is:  BP: 145/86 mmHg  Monitor your blood pressure  Limit your salt and alcohol intake  Many individuals will require more than one medication for high blood pressure  DIABETES (A1c is a blood sugar average for last 3 months) Goal HGBA1c is under 7% (HBGA1c is blood sugar average for last 3 months)  Diabetes: {STROKE DC DIABETES:22357}    Lab Results  Component Value Date   HGBA1C 5.7* 02/11/2012     Your HGBA1c can be lowered with medications, healthy diet, and exercise.  Check your blood sugar as directed by your physician  Call your physician if you experience unexplained or low blood sugars.  PHYSICAL ACTIVITY/REHABILITATION Goal is 30 minutes at least 4 days per week    {STROKE DC ACTIVITY/REHAB:22359}  Activity decreases your risk of heart attack and stroke and makes your heart stronger.  It helps control your weight and blood pressure; helps you relax and can improve your mood.  Participate in a  regular exercise program.  Talk with your doctor about the best form of exercise for you (dancing, walking, swimming, cycling).  DIET/WEIGHT Goal is to maintain a healthy weight  Your discharge diet is: Cardiac *** liquids Your height is:  Height: 6' (182.9 cm) Your current weight is: Weight: 68.9 kg (151 lb 14.4 oz) Your Body Mass Index (BMI) is:  BMI (Calculated): 20.6   Following the type of diet specifically designed for you will help prevent another stroke.  Your goal weight range is:  ***  Your goal Body Mass Index (BMI) is 19-24.  Healthy food habits can help reduce 3 risk factors for stroke:  High cholesterol, hypertension, and excess weight.  RESOURCES Stroke/Support Group:  Call [phone]  they meet the 3rd Sunday of the month on the Rehab Unit at [facility], [place] ( no meetings June, July & Aug).  STROKE EDUCATION PROVIDED/REVIEWED AND GIVEN TO PATIENT Stroke warning signs and symptoms How to activate emergency medical system (call 911). Medications prescribed at discharge. Need for follow-up after discharge. Personal risk factors for stroke. Pneumonia vaccine given:   {STROKE DC YES/NO/DATE:22363} Flu vaccine given:   {STROKE DC YES/NO/DATE:22363} My questions have been answered, the writing is legible, and I understand these instructions.  I will adhere to these goals & educational materials that have been provided to me after my discharge from the hospital.

## 2012-02-13 NOTE — Progress Notes (Signed)
Physical Therapy Treatment Patient Details Name: Rodney Page MRN: [id number] DOB: 1961/06/08 Today's Date: 02/13/2012  PT Assessment/Plan  PT - Assessment/Plan Comments on Treatment Session: The patient is frustrated with his physical deficits despite the fact that he is doing so well.  He has never had to deal with a physical impairment before.  We reviewed signs of stroke and why it is important to act fast.  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  I also issued him a HEP handout of the balance and ankle strengthening exercises that he did during today's session.  He would continue to benefit from both outpatient PT and OT for high  PT Frequency: Min 4X/week Follow Up Recommendations: Outpatient PT (OT recommending outpatient OT as well) Equipment Recommended: None recommended by PT PT Goals  Acute Rehab PT Goals PT Goal: Ambulate - Progress: Progressing toward goal PT Goal: Up/Down Stairs - Progress: Progressing toward goal Pt will Perform Home Exercise Program: Independently (HEP provided copy in paper chart) PT Goal: Perform Home Exercise Program - Progress: Goal set today Additional Goals Additional Goal #1: The patient will cscore greater than or equal to a 47/56 on his Berg Balance test to show decreased fall risk PT Goal: Additional Goal #1 - Progress: Goal set today Additional Goal #2: The patient will score greater than a 22/24 on his DGI to show improved balance and decreased risk of falls PT Goal: Additional Goal #2 - Progress: Goal set today Additional Goal #3: The patient will be able to demonstrate gait speed greater than or equal to 4.4 ft/sec to demonstrate safety crossing the street PT Goal: Additional Goal #3 - Progress: Goal set today  PT Treatment Precautions/Restrictions  Precautions Precautions:  (None) Restrictions Weight Bearing Restrictions: No Mobility (including Balance) Bed Mobility Supine to Sit: 7:  Independent Sitting - Scoot to Edge of Bed: 7: Independent Sit to Supine: 7: Independent Transfers Sit to Stand: 7: Independent Stand to Sit: 7: Independent Ambulation/Gait Ambulation/Gait Assistance: 5: Supervision Ambulation/Gait Assistance Details (indicate cue type and reason): Mildly unsteady, steppage gait left, decreased L ankle dorsiflexion with fatigue.   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  ) Other Exercises: single leg stand multiple trials bil <5 seconds (given as HEP-handout provided.  ) Other Exercises: standing and seated heel raises and toe raises x 20 each bil.   (given as HEP-handout provided.  ) End of Session PT - End of Session Activity Tolerance: Patient tolerated treatment well Patient left: in bed General Behavior During Session: [facility] for tasks performed Cognition: [facility] for tasks performed  Lillyauna Jenkinson B. Traven Davids, PT, DPT [phone] 02/13/2012, 10:31 AM

## 2012-02-13 NOTE — Progress Notes (Signed)
   CARE MANAGEMENT NOTE 02/13/2012  Patient:  Rodney Page, Rodney Page   Account Number:  [id number]  Date Initiated:  02/12/2012  Documentation initiated by:  Donn Pierini  Subjective/Objective Assessment:   Pt admitted iwth CVA- stroke workup in progress     Action/Plan:   PTA pt lived at home with spouse, was independent with ADLS, PT/OT evals ordered   Anticipated DC Date:  02/13/2012   Anticipated DC Plan:  HOME/SELF CARE      DC Planning Services  CM consult  OP Neuro Rehab      Choice offered to / List presented to:             Status of service:  Completed, signed off Medicare Important Message given?   (If response is "NO", the following Medicare IM given date fields will be blank) Date Medicare IM given:   Date Additional Medicare IM given:    Discharge Disposition:  HOME/SELF CARE  Per UR Regulation:  Reviewed for med. necessity/level of care/duration of stay  Comments:  PCP- Catha Gosselin  02/13/12- 1030- Donn Pierini RN, BSN [phone] Spoke with pt at bedside regarding d/c needs. Per PT/OT notes- recommending both PT/OT outpt therapy. Pt is agreeable to going to outpt therapy and has transportation. Will fax referral to [facility] neuro rehab for outpt PT/OT. Location info and phone # given to pt. Pt states he has medication benefits and lives at home with wife. No further d/c needs noted.

## 2012-02-13 NOTE — Interval H&P Note (Signed)
History and Physical Interval Note:  02/13/2012 12:00 PM  Rodney Page  has presented today for surgery, with the diagnosis of stroke  The various methods of treatment have been discussed with the patient and family. After consideration of risks, benefits and other options for treatment, the patient has consented to  Procedure(s) (LRB): TRANSESOPHAGEAL ECHOCARDIOGRAM (TEE) (N/A) as a surgical intervention .  The patients' history has been reviewed, patient examined, no change in status, stable for surgery.  I have reviewed the patients' chart and labs.  Questions were answered to the patient's satisfaction.     Elyn Aquas.

## 2012-02-13 NOTE — Progress Notes (Signed)
Subjective:  Rodney Page is an 51 y.o. male who came to ED after >24 hours of left arm and leg tingling along with some noted difficulty with gait. He awoke on 3/5 with symptoms and they did not seem to be improving. In 2009 he had similar tingling in right arm --stroke workup revealed no abnormality. CT head today showed equivocal stroke involving the right frontal region. 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 No acute right frontal stroke was seen on MRI. 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 He denies any new symptoms. He denies any known significant vascular risk factors. There is no history of deepened thrombosis, pulmonary embolism or rash. He does have one cousin who had a stroke in his 40s Patient is in TEE room about to have the procedure by Dr Elease Hashimoto Objective: Vital signs in last 24 hours: Temp:  [97.6 F (36.4 C)-98.3 F (36.8 C)] 98.2 F (36.8 C) (03/08 1049) Pulse Rate:  [58-85] 73  (03/08 1049) Resp:  [14-20] 14  (03/08 1210) BP: (147-172)/(78-99) 148/84 mmHg (03/08 1210) SpO2:  [95 %-100 %] 100 % (03/08 1205) Weight change:  Last BM Date: 02/09/12  Intake/Output from previous day: 03/07 0701 - 03/08 0700 In: 403 [P.O.:400; I.V.:3] Out: -  Intake/Output this shift:    Physical exam reveals healthy African American young male not in distress. Afebrile. Head is nontraumatic. Neck is supple without bruit. Hearing is normal.Cardiac exam no murmur or gallop. Lungs clear to auscultation. Abdomen soft nontender Neurological Exam Awake alert oriented x 3 normal speech and language. Mild left lower face asymmetry. Tongue midline. No drift. Mild diminished fine finger movements on left. Orbits  right over left upper extremity. Mild left grip weak.. Normal sensation . Normal coordination. Gait deferred Lab Results:  Basename 02/11/12 1020 02/11/12 1012  WBC -- 6.3  HGB 16.0 14.9  HCT 47.0 43.5  PLT -- 251   BMET  Basename 02/11/12 1020 02/11/12 1012  NA 140 139  K 3.7 3.6  CL 103 104  CO2 -- 28  GLUCOSE 73 73  BUN 8 8  CREATININE 0.80 0.70  CALCIUM -- 9.6    Studies/Results: Dg Chest 2 View  02/11/2012  *RADIOLOGY REPORT*  Clinical Data: Acute stroke.  CHEST - 2 VIEW  Comparison: None.  Findings: The heart size and pulmonary vascularity are normal and the lungs are clear.  No osseous abnormality.  IMPRESSION: Normal chest.  Original Report Authenticated By: Gwynn Burly, M.D.   [name] Head Wo Contrast  02/11/2012  *RADIOLOGY REPORT*  Clinical Data:  Off balance.  Left leg and left arm draining. History of seizures.  MRI HEAD WITHOUT CONTRAST MRA HEAD WITHOUT CONTRAST  Technique: Multiplanar, multiecho pulse sequences of the brain and surrounding structures were obtained according to standard protocol without intravenous contrast.  Angiographic images of the head were obtained using MRA technique without contrast.  Comparison: 02/11/2012 CT.  03/14/2008 MR.  MRI HEAD  Findings:  Small acute non hemorrhagic infarct posterior limb of the right internal capsule.  Remote infarcts within the thalami, basal ganglia and pons.  Progressive white matter type changes most likely related to result of small vessel disease given the surrounding findings.  Result of vasculitis  not entirely excluded.  No hydrocephalus.  No intracranial mass lesion detected on this unenhanced exam.  No intracranial hemorrhage.  Mild degenerative changes C1-2 articulation.  Primary veins extending through parietal calvarium.  Appearance is unchanged.  IMPRESSION: Small acute non hemorrhagic infarct posterior limb of the right internal capsule.  Remote infarcts within the thalami, basal ganglia and pons.   Progressive white matter type changes most likely related to result of small vessel disease  MRA HEAD  Findings: Mild narrowing petrous segment of the internal carotid artery bilaterally greater on the right.  Ectasia of the distal vertical cervical segment of the internal carotid artery bilaterally greater on the right.  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  Stability can be confirmed on follow-up.  Bulbous appearance of the left middle cerebral artery bifurcation from which vessels arise.  Fetal type origin of the right posterior cerebral artery.  Middle cerebral artery branch vessel irregularity.  Smooth narrowing left vertebral artery after the takeoff of the left PICA.  Mild irregularity of the mid aspect of the basilar artery with associated mild narrowing.  Moderate narrowing P1 segments right posterior cerebral artery.  Mild narrowing posterior cerebral artery branch vessels.  IMPRESSION: Intracranial atherosclerotic type changes as detailed above.  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  Stability can be confirmed on follow-up.  Results discussed with Dr. Lynelle Doctor 02/11/2012 2:16 p.m.  Original Report Authenticated By: Fuller Canada, M.D.   Mr Brain Wo Contrast  02/11/2012  *RADIOLOGY REPORT*  Clinical Data:  Off balance.  Left leg and left arm draining. History of seizures.  MRI HEAD WITHOUT CONTRAST MRA HEAD WITHOUT CONTRAST  Technique: Multiplanar, multiecho pulse sequences of the brain and surrounding structures were obtained according to standard protocol without intravenous contrast.  Angiographic images of the head were obtained using MRA technique without contrast.  Comparison:  02/11/2012 CT.  03/14/2008 MR.  MRI HEAD  Findings:  Small acute non hemorrhagic infarct posterior limb of the right internal capsule.  Remote infarcts within the thalami, basal ganglia and pons.  Progressive white matter type changes most likely related to result of small vessel disease given the surrounding findings.  Result of vasculitis not entirely excluded.  No hydrocephalus.  No intracranial mass lesion detected on this unenhanced exam.  No intracranial hemorrhage.  Mild degenerative changes C1-2 articulation.  Primary veins extending through parietal calvarium.  Appearance is unchanged.  IMPRESSION: Small acute non hemorrhagic infarct posterior limb of the right internal capsule.  Remote infarcts within the thalami, basal ganglia and pons.  Progressive white matter type changes most likely related to result of small vessel disease  MRA HEAD  Findings: Mild narrowing petrous segment of the internal carotid artery bilaterally greater on the right.  Ectasia of the distal vertical cervical segment of the internal carotid artery bilaterally greater on the right.  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  Stability can be confirmed on follow-up.  Bulbous appearance of the left middle cerebral artery bifurcation from which vessels arise.  Fetal type origin of the right posterior cerebral artery.  Middle cerebral  artery branch vessel irregularity.  Smooth narrowing left vertebral artery after the takeoff of the left PICA.  Mild irregularity of the mid aspect of the basilar artery with associated mild narrowing.  Moderate narrowing P1 segments right posterior cerebral artery.  Mild narrowing posterior cerebral artery branch vessels.  IMPRESSION: Intracranial atherosclerotic type changes as detailed above.  2 mm aneurysm lateral aspect of the left internal  carotid artery cavernous segment.  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  Stability can be confirmed on follow-up.  Results discussed with Dr. Lynelle Doctor 02/11/2012 2:16 p.m.  Original Report Authenticated By: Fuller Canada, M.D.    HIV negative UDS negative Hypercoagulable panel negative CUS no stenosis 2DEcho normal EF Medications: I have reviewed the patient's current medications.  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 Workup for stroke in young is indicated  Plan check  transesophageal echocardiogram,  Discontinue aspirin and continue Plavix alone.  LOS: 2 days   Kaheem Halleck,PRAMODKUMAR P 02/13/2012, 12:17 PM

## 2012-02-16 ENCOUNTER — Encounter (HOSPITAL_COMMUNITY): Payer: Self-pay | Admitting: Cardiovascular Disease

## 2012-02-17 LAB — CARDIOLIPIN ANTIBODIES, IGG, IGM, IGA
Anticardiolipin IgA: 4 APL U/mL — ABNORMAL LOW (ref <22)
Anticardiolipin IgM: 11 MPL U/mL — ABNORMAL HIGH (ref <11)

## 2012-02-17 LAB — BETA-2-GLYCOPROTEIN I ABS, IGG/M/A
Beta-2 Glyco I IgG: 2 G Units
Beta-2-Glycoprotein I IgA: 3 A Units
Beta-2-Glycoprotein I IgM: 5 M Units

## 2012-02-27 ENCOUNTER — Encounter: Payer: BC Managed Care – PPO | Admitting: Vascular Surgery

## 2012-03-01 ENCOUNTER — Encounter: Payer: Self-pay | Admitting: Vascular Surgery

## 2012-03-02 ENCOUNTER — Encounter: Payer: Self-pay | Admitting: Vascular Surgery

## 2012-03-02 ENCOUNTER — Ambulatory Visit (INDEPENDENT_AMBULATORY_CARE_PROVIDER_SITE_OTHER): Payer: BC Managed Care – PPO | Admitting: Vascular Surgery

## 2012-03-02 VITALS — BP 131/79 | HR 75 | Resp 20 | Ht 72.0 in | Wt 161.0 lb

## 2012-03-02 DIAGNOSIS — I671 Cerebral aneurysm, nonruptured: Secondary | ICD-10-CM

## 2012-03-02 NOTE — Progress Notes (Signed)
Subjective:     Patient ID: Rodney Page, male   DOB: July 30, 1961, 51 y.o.   MRN: [id number]  HPI 51 year old African American male recently suffered a right brain CVA documented by MRI. He had some clumsiness in his left upper and lower extremities which led to the diagnosis. During his evaluation an MRA was obtained. I have reviewed this. 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 He also had carotid duplex exam which revealed no evidence of any significant internal carotid stenosis in the extracranial vessels. His MRI revealed stroke to be in the posterior limb of the right internal capsule. He was referred today for evaluation. He has been seen by Dr.Sethi. He denies any history of previous stroke although the MRI suggested that he did have a stroke in the past. He has no active symptoms of amaurosis fugax, hemiparesis, aphasia, diplopia, blurred vision, or syncope.  Past Medical History  Diagnosis Date  . Seizure   . Stroke     possible stroke 2009 per wife  . Hypertension   . Hyperlipidemia     History  Substance Use Topics  . Smoking status: Former Smoker -- 1.0 packs/day for 20 years    Types: Cigarettes    Quit date: 12/08/1997  . Smokeless tobacco: Never Used  . Alcohol Use: No    Family History  Problem Relation Age of Onset  . Heart disease Mother   . Hypertension Sister   . Hypertension Brother     Allergies  Allergen Reactions  . Iodinated Diagnostic Agents Other (See Comments)    MRI DYE    Current outpatient prescriptions:amLODipine (NORVASC) 5 MG tablet, Take 10 mg by mouth daily., Disp: , Rfl: ;  clopidogrel (PLAVIX) 75 MG tablet, Take 1 tablet (75 mg total) by mouth daily with breakfast., Disp: 30 tablet, Rfl: 0;  Cyanocobalamin (VITAMIN B-12 CR PO), Take 1 tablet by mouth daily., Disp: , Rfl: ;  Multiple Vitamin (MULITIVITAMIN WITH MINERALS) TABS, Take 1 tablet by mouth  daily., Disp: , Rfl:  OVER THE COUNTER MEDICATION, Take 1 tablet by mouth daily. Medication: over the counter allergy medication, Disp: , Rfl: ;  OVER THE COUNTER MEDICATION, Take 1 tablet by mouth daily. Medicaion: an additional "centrum" supplement that works with his multivitamin.  Unsure of name., Disp: , Rfl:   BP 131/79  Pulse 75  Resp 20  Ht 6' (1.829 m)  Wt 161 lb (73.029 kg)  BMI 21.84 kg/m2  Body mass index is 21.84 kg/(m^2).         Review of Systems active patient with no history of chest pain, dyspnea on exertion, PND, orthopnea, claudication, or other symptoms all systems are negative and complete review of systems    Objective:   Physical Exam blood pressure 131/79 heart rate 75 respirations 20 Gen.-alert and oriented x3 in no apparent distress HEENT normal for age Lungs no rhonchi or wheezing Cardiovascular regular rhythm no murmurs carotid pulses 3+ palpable no bruits audible Abdomen soft nontender no palpable masses Musculoskeletal free of  major deformities Skin clear -no rashes Neurologic normal Lower extremities 3+ femoral and 1+ dorsalis pedis pulses palpable bilaterally with no edema       Assessment:     History of right brain CVA 3 weeks ago recovering nicely Incidental finding of 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 Clarene Duke

## 2012-07-07 ENCOUNTER — Encounter (HOSPITAL_COMMUNITY): Payer: Self-pay | Admitting: Pharmacy Technician

## 2012-07-20 ENCOUNTER — Encounter (HOSPITAL_COMMUNITY): Admission: RE | Payer: Self-pay | Source: Ambulatory Visit

## 2012-07-20 ENCOUNTER — Ambulatory Visit (HOSPITAL_COMMUNITY): Admission: RE | Admit: 2012-07-20 | Payer: BC Managed Care – PPO | Source: Ambulatory Visit | Admitting: Cardiology

## 2012-07-20 SURGERY — PATENT FORAMEN OVALE CLOSURE
Anesthesia: LOCAL

## 2012-08-11 ENCOUNTER — Encounter (HOSPITAL_COMMUNITY): Payer: Self-pay | Admitting: Respiratory Therapy

## 2012-08-24 ENCOUNTER — Encounter (HOSPITAL_COMMUNITY)
Admission: RE | Disposition: A | Discharge status: Home / Self Care | Payer: Self-pay | Source: Ambulatory Visit | Attending: Cardiology

## 2012-08-24 ENCOUNTER — Encounter (HOSPITAL_COMMUNITY): Payer: Self-pay | Admitting: General Practice

## 2012-08-24 ENCOUNTER — Ambulatory Visit (HOSPITAL_COMMUNITY)
Admission: RE | Admit: 2012-08-24 | Discharge: 2012-08-25 | Disposition: A | Discharge status: Home / Self Care | Payer: BC Managed Care – PPO | Source: Ambulatory Visit | Attending: Cardiology | Admitting: Cardiology

## 2012-08-24 DIAGNOSIS — Q2111 Secundum atrial septal defect: Secondary | ICD-10-CM | POA: Insufficient documentation

## 2012-08-24 DIAGNOSIS — E785 Hyperlipidemia, unspecified: Secondary | ICD-10-CM | POA: Insufficient documentation

## 2012-08-24 DIAGNOSIS — Q211 Atrial septal defect: Secondary | ICD-10-CM | POA: Insufficient documentation

## 2012-08-24 DIAGNOSIS — I639 Cerebral infarction, unspecified: Secondary | ICD-10-CM

## 2012-08-24 DIAGNOSIS — I10 Essential (primary) hypertension: Secondary | ICD-10-CM | POA: Insufficient documentation

## 2012-08-24 HISTORY — PX: PATENT FORAMEN OVALE CLOSURE: SHX2181

## 2012-08-24 HISTORY — DX: Cerebral aneurysm, nonruptured: I67.1

## 2012-08-24 HISTORY — PX: PATENT FORAMEN OVALE CLOSURE: SHX5483

## 2012-08-24 LAB — POCT ACTIVATED CLOTTING TIME
Activated Clotting Time: 254 seconds
Activated Clotting Time: 209 seconds

## 2012-08-24 SURGERY — PATENT FORAMEN OVALE CLOSURE
Anesthesia: LOCAL | Laterality: Bilateral

## 2012-08-24 MED ORDER — ATORVASTATIN CALCIUM 10 MG PO TABS
10.0000 mg | ORAL_TABLET | Freq: Every day | ORAL | Status: DC
  Filled 2012-08-24: qty 1

## 2012-08-24 MED ORDER — SODIUM CHLORIDE 0.9 % IJ SOLN: 3.0000 mL | Freq: Two times a day (BID) | INTRAMUSCULAR | Status: DC

## 2012-08-24 MED ORDER — ASPIRIN 81 MG PO CHEW
324.0000 mg | CHEWABLE_TABLET | ORAL | Status: AC
  Administered 2012-08-24: 324 mg via ORAL
  Filled 2012-08-24: qty 4

## 2012-08-24 MED ORDER — AMLODIPINE BESYLATE 10 MG PO TABS
10.0000 mg | ORAL_TABLET | Freq: Every day | ORAL | Status: DC
  Filled 2012-08-24: qty 1

## 2012-08-24 MED ORDER — SODIUM CHLORIDE 0.9 % IJ SOLN: 3.0000 mL | INTRAMUSCULAR | Status: DC | PRN

## 2012-08-24 MED ORDER — SODIUM CHLORIDE 0.9 % IV SOLN: 250.0000 mL | INTRAVENOUS | Status: DC | PRN

## 2012-08-24 MED ORDER — ACETAMINOPHEN 325 MG PO TABS: 650.0000 mg | ORAL_TABLET | ORAL | Status: DC | PRN

## 2012-08-24 MED ORDER — ASPIRIN 81 MG PO CHEW
81.0000 mg | CHEWABLE_TABLET | Freq: Every day | ORAL | Status: DC
  Filled 2012-08-24: qty 1

## 2012-08-24 MED ORDER — HYDROMORPHONE HCL PF 2 MG/ML IJ SOLN
INTRAMUSCULAR | Status: AC
  Filled 2012-08-24: qty 1

## 2012-08-24 MED ORDER — SODIUM CHLORIDE 0.9 % IV SOLN: 250.0000 mL | INTRAVENOUS | Status: DC

## 2012-08-24 MED ORDER — CLOPIDOGREL BISULFATE 75 MG PO TABS
75.0000 mg | ORAL_TABLET | Freq: Every day | ORAL | Status: DC
  Filled 2012-08-24: qty 1

## 2012-08-24 MED ORDER — MIDAZOLAM HCL 2 MG/2ML IJ SOLN
INTRAMUSCULAR | Status: AC
  Filled 2012-08-24: qty 2

## 2012-08-24 MED ORDER — HEPARIN SODIUM (PORCINE) 1000 UNIT/ML IJ SOLN
INTRAMUSCULAR | Status: AC
  Filled 2012-08-24: qty 1

## 2012-08-24 MED ORDER — SODIUM CHLORIDE 0.9 % IV SOLN
INTRAVENOUS | Status: DC
  Administered 2012-08-24: 1000 mL via INTRAVENOUS

## 2012-08-24 MED ORDER — LIDOCAINE HCL (PF) 1 % IJ SOLN
INTRAMUSCULAR | Status: AC
  Filled 2012-08-24: qty 30

## 2012-08-24 MED ORDER — ONDANSETRON HCL 4 MG/2ML IJ SOLN: 4.0000 mg | Freq: Four times a day (QID) | INTRAMUSCULAR | Status: DC | PRN

## 2012-08-24 NOTE — Progress Notes (Signed)
CALLED TO CLIENT'S ROOM AND BLOOD NOTED IN IV TUBING AND CLIENT'S VISITOR STATES CLIENT TOOK IV APART TO GO TO BATHROOM; IV TUBING CHANGED AND CLIENT ADVISED TO NOT TAKE IV APART AND VOICED UNDERSTANDING

## 2012-08-24 NOTE — H&P (Signed)
Rodney Page is an 51 y.o. male.    HOPI: Mr. Cardozo is a 51 year old gentleman of African American Heritage who is referred to me for evaluation of stroke and association of stroke and PFO.  He was recently admitted to [facility] on 02/11/2012 and discharged on 02/13/2012 when he presented with difficulty with balance. MRI of the brain had revealed acute nonhemorrhagic infarct in the posterior limb of the right internal capsule.  There were also remote infarcts noted within the calamine, basal ganglia and pons.  There is also progressive white matter changes noted.  Diffuse atherosclerotic changes were also evident in the intracranial vessels along with a small 2 mm aneurysm noted on the lateral aspect of the left internal carotid artery at the cavernous segment.  A TEE that was performed revealed presence of PFO with right to left shunting.  No further details regarding the TEE are available.  He is now randomized in the secondary stroke prevention trial "REDUCE".  He has had multiple written questions  from his wife including success, risks, benefits, make and model of the device and I had already discussed them. He has not had any recurrence of symptoms. Tolerating all medications without side-effects. Otherwise no specific complaints.  He states all his neurologic deficits have pretty much resolved.   He denies any chest pain, shortness of breath, PND or orthopnea.   He denies any symptoms to suggest palpitations.    ROS: Arthritis-no;  Cramping of the legs and feet at night or with activity-no; Diabetes-no;  Hypothyroidism-no;  Previous Stroke-yes, 02/11/12, Previous GI Bleed-no ,  Recent weight change-no, Symptoms to suggest sleep apnea like loud snoring, daytime sleepiness-no, Other systems negative.     Known allergies: NKDA. Past Medical History  Diagnosis Date  . Seizure   . Stroke     possible stroke 2009 per wife  . Hypertension   . Hyperlipidemia     Past Surgical  History  Procedure Date  . Tee without cardioversion 02/13/2012    Procedure: TRANSESOPHAGEAL ECHOCARDIOGRAM (TEE);  Surgeon: Vesta Mixer, MD;  Location: [facility] ENDOSCOPY;  Service: Cardiovascular;  Laterality: N/A;    Family History  Problem Relation Age of Onset  . Heart disease Mother   . Hypertension Sister   . Hypertension Brother    Social History:  reports that he quit smoking about 14 years ago. His smoking use included Cigarettes. He has a 20 pack-year smoking history. He has never used smokeless tobacco. He reports that he does not drink alcohol or use illicit drugs.  Allergies:  Allergies  Allergen Reactions  . Iodinated Diagnostic Agents Other (See Comments)    MRI DYE    Medications Prior to Admission  Medication Sig Dispense Refill  . amLODipine (NORVASC) 5 MG tablet Take 10 mg by mouth daily.      Marland Kitchen atorvastatin (LIPITOR) 10 MG tablet Take 10 mg by mouth daily.      . clopidogrel (PLAVIX) 75 MG tablet Take 1 tablet (75 mg total) by mouth daily with breakfast.  30 tablet  0  . Cyanocobalamin (VITAMIN B-12 CR PO) Take 1 tablet by mouth daily.      . Multiple Vitamin (MULITIVITAMIN WITH MINERALS) TABS Take 1 tablet by mouth daily.        Blood pressure 146/80, pulse 71, temperature 98 F (36.7 C), temperature source Oral, resp. rate 18, height 6' (1.829 m), weight 76.204 kg (168 lb), SpO2 100.00%.  Well build and normal body habitus who  is  in no acute distress. Appears  stated age. Alert Ox3.    HEENT: normal limits. PERRLA, No JVD.  There is no cyanosis.    CARDIAC EXAM: S1 S2 normal, no gallop, II/VI SEM noted in apex and right sternal border.    CHEST EXAM: No tenderness of chest wall.  LUNGS: Clear to percuss and auscultate. No rales or ronchi.    ABDOMEN: No hepatosplenomegaly.  BS normal in all 4 quadrants. Abdomen is non-tender.    EXTREMITY: Full range of movementes, No edema.  MUSCULOSKELETAL EXAM: Intact with full range of motion in all 4 extremities.      NEUROLOGIC EXAM: Grossly intact without any focal deficits. Alert O x 3.     VASCULAR EXAM: No skin breakdown. Carotids  normal. Extremities: Femoral pulse normal. Popliteal pulse normal ; Pedal pulse normal.  Otherwise No prominent pulse felt in the abdomen. No varicose veins.  Assessment/Plan   1. 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 ECG 08/24/12: Sinus @ 71/min. Normal QRS. Early repolarization. No ischemia. Non specific T change.  2. Abnormal blood sugar without diabetes mellitus. 3. Normal lipid status.  Labs done on 02/12/2012.  Total cholesterol of 132, triglycerides 90, HDL 59 and LDL was 53.  Hypercoagulable workup was within normal limits.  Hemoglobin A1c was 5.7%.  CBC and CMP was within normal limits.  Rec: Patient scheduled for PFO closure under REDUCE Protocol for secondary preventin of stroke.    Pamella Pert, MD 08/24/2012, 10:00 AM

## 2012-08-24 NOTE — Research (Signed)
Patient was enrolled in the [employer]ORE Reduce trial on 04/08/2012 and randomized to the PFO closure arm. Patient was admitted today for the PFO closure procedure. No reported AE's or SAE's since last office visit. ECG was obtain in pre-op area. Neuro exam was performed by Dr. Pearlean Brownie prior to procedure. Patient underwent PFO closure procedure. No reported complication during procedure. Device was successfully implanted.

## 2012-08-24 NOTE — CV Procedure (Signed)
Procedure Performed: 1. Intracardiac Echo Guided closure of the atrial septal defect: Type: PFO 2. Closure of the ASD with a 20 mm Helex Gore Septal Occluder. Tunnel length 8.9 mm.   Indication: Patient is a 51 year old man with history of  stroke. Other etiology for cryptogenic stroke were ruled out and TEE had confirmed that patient had a  PFO with  positive double contrast study for right to left shunting. 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 Hence brought to the cardiac catheterization suite for closure of the PFO for secondary prevention of stroke under REDUCE research protocol.  Interventional data: Successful closure of the atrial septal defect under intracardiac echo guidance with implantation of a 20 millimeter Helex septal occluder. Post-deployment double contrast study revealed no residual shunting.  Recommendation: Patient will continue with dual antiplatelet therapy for a period of 30 days. He'll also need endocarditis prophylaxis for period  of 6 months. After that he can be on aspirin indefinitely or Plavix indefinitely depending on  clinical circumstances.    Technique: Under sterile precautions using 9 French right femoral venous access and a 6 French right femoral venous access a intracardiac echo probe was advanced under fluoroscopic guidance into the right atrium via 9 French sheath. Cardiac structures which are analyzed. The images were standardized and the device was fixed. Using heparin for anticoagulation, a 290 cm Rosen wire and a multipurpose A2 catheter to cross the septal defect and the wire was carefully positioned in the left upper pulmonary vein. Maintaining ACT greater than 200 we then prepared the Helex septal occluder in the usual fashion making sure that there were no air bubbles in the device.  The 6 French right femoral venous access sheath was then exchanged to an 12  [place] sheath. And over the [facility] wire the Helex occluder was advanced  under fluoroscopic and intracardiac echo guidance and the eighth of the defect was crossed. The left atrial disc was carefully deployed and then after confirming appropriate deployment of the left into the, the right atrial disc was carefully deployed. After the device was deployed there was some doubt whether both the distal where on the left side of the interatrial septum. Patient was made to withdraw the device back into the guiding catheter and was eventually pulled out of the body. There was some difficulty with advancing the entire device do to a smaller 11 French sheath which was made by [employer] and we would've preferred a [employer] sheath. By maintaining access to the left atrium and was able to easily without any difficulty capture the device in entirety very easily. The whole process was begun and a 14 French sheath was introduced into the right common femoral vein and then procedure went uneventfully. He the device in a similar fashion and advanced it into the left atrial side and the atrial disc was easily deployed followed by deployment of the right atrial disc and confirming the deployment right fluoroscopic and intracardiac echo guidance. The intracardiac images were carefully analyzed and fluoroscopic images with utilized to evaluate appropriate disc deployment on each side of the septal defect. After confirming there the device was released and the delivery system was pulled out of the body without any complications. The contrast injection was performed pre-and post. There were about 15 to 25 bubbles Pre-closure and 0 bubbles post closure.  The venous access sheath was sutured in place and patient was transferred to recovery area in stable condition. There was no immediate competitions. Patient  of procedure well. She'll be discharged home in the morning if he remains stable.

## 2012-08-25 NOTE — Discharge Summary (Signed)
Physician Discharge Summary  Patient ID: Rodney Page MRN: [id number] DOB/AGE: November 03, 1961 51 y.o.  Admit date: 08/24/2012 Discharge date: 08/25/2012  Primary Discharge Diagnosis Patent common wall and, atrial septal defect secundum type.  Secondary Discharge Diagnosis Hypertension  Significant Diagnostic Studies: Intracardiac echo guided closure of the patent common wall and with implantation of a 20 mm helex septal occluder performed on 08/24/2012.  Consults:   Hospital Course: Patient was admitted an elective basis for closure of the PFO under REDUCE  protocol for secondary prevention of stroke. No complications were evident. She did well. The following day his groin is stable. He was felt stable for discharge.   Discharge Exam: Blood pressure 126/87, pulse 72, temperature 98.1 F (36.7 C), temperature source Oral, resp. rate 16, height 6' (1.829 m), weight 73.3 kg (161 lb 9.6 oz), SpO2 98.00%.   Well build and normal body habitus who is in no acute distress. Appears stated age. Alert Ox3.  HEENT: normal limits. PERRLA, No JVD. There is no cyanosis.  CARDIAC EXAM: S1 S2 normal, no gallop, II/VI SEM noted in apex and right sternal border.  CHEST EXAM: No tenderness of chest wall. LUNGS: Clear to percuss and auscultate. No rales or ronchi.  ABDOMEN: No hepatosplenomegaly. BS normal in all 4 quadrants. Abdomen is non-tender.  EXTREMITY: Full range of movementes, No edema. MUSCULOSKELETAL EXAM: Intact with full range of motion in all 4 extremities.  NEUROLOGIC EXAM: Grossly intact without any focal deficits. Alert O x 3.  VASCULAR EXAM: No skin breakdown. Carotids normal. Extremities: Femoral pulse normal. Popliteal pulse normal ; Pedal pulse normal. Otherwise No prominent pulse felt in the abdomen. No varicose veins. Right groin site without any hematoma or tenderness or warmth.  Labs:   Lab Results  Component Value Date   WBC 6.3 02/11/2012   HGB 16.0 02/11/2012   HCT 47.0  02/11/2012   MCV 85.6 02/11/2012   PLT 251 02/11/2012     Lipid Panel     Component Value Date/Time   CHOL 132 02/12/2012 0535   TRIG 98 02/12/2012 0535   HDL 59 02/12/2012 0535   CHOLHDL 2.2 02/12/2012 0535   VLDL 20 02/12/2012 0535   LDLCALC 53 02/12/2012 0535      EKG: Normal sinus rhythm, normal intervals. No evidence of ischemia. Normal EKG Echocardiogram: Pending at the time of discharge. Will be performed prior to discharge.  FOLLOW UP PLANS AND APPOINTMENTS    Medication List     As of 08/25/2012  9:04 AM    TAKE these medications         amLODipine 5 MG tablet   Commonly known as: NORVASC   Take 10 mg by mouth daily.      atorvastatin 10 MG tablet   Commonly known as: LIPITOR   Take 10 mg by mouth daily.      clopidogrel 75 MG tablet   Commonly known as: PLAVIX   Take 1 tablet (75 mg total) by mouth daily with breakfast.      multivitamin with minerals Tabs   Take 1 tablet by mouth daily.      VITAMIN B-12 CR PO   Take 1 tablet by mouth daily.           Follow-up Information    Follow up with Pamella Pert, MD. In 2 weeks. (Call to be seen)    Contact information:   [address]  [place] [address] [phone]  Pamella Pert, MD 08/25/2012, 9:04 AM

## 2012-08-25 NOTE — Progress Notes (Addendum)
  Echocardiogram 2D Echocardiogram (with bubble study) has been performed.  [name] 08/25/2012, 9:52 AM

## 2012-08-25 NOTE — Plan of Care (Signed)
Problem: Phase I Progression Outcomes Goal: Initial discharge plan identified Outcome: Completed/Met Date Met:  08/24/12 Home tommorrow

## 2012-08-25 NOTE — Plan of Care (Signed)
Problem: Phase III Progression Outcomes Goal: No angina with increased activity Outcome: Completed/Met Date Met:  08/25/12 Ambulating with no complaints of pain or shortness of breath Goal: Tolerating diet Outcome: Completed/Met Date Met:  08/25/12 Tolerating diet ate breakfast with no problems Goal: Discharge plan remains appropriate-arrangements made Outcome: Completed/Met Date Met:  08/25/12 Discharge home today as planned Goal: Vital signs stable Outcome: Completed/Met Date Met:  08/25/12 VSS  Goal: Vascular site scale level 0 - I Vascular Site Scale Level 0: No bruising/bleeding/hematoma Level I (Mild): Bruising/Ecchymosis, minimal bleeding/ooozing, palpable hematoma < 3 cm Level II (Moderate): Bleeding not affecting hemodynamic parameters, pseudoaneurysm, palpable hematoma > 3 cm  Outcome: Completed/Met Date Met:  08/25/12 Right groin site level 0, no swelling, bleeding or hematoma noted, bandaid dry and intact  Problem: Discharge Progression Outcomes Goal: Pain controlled with appropriate interventions Outcome: Not Applicable Date Met:  08/25/12 Patient remains pain free with no complaints of pain or discomfort Goal: Hemodynamically stable Outcome: Completed/Met Date Met:  08/25/12 VSS no change in patients assessment with activity.

## 2012-08-25 NOTE — Plan of Care (Signed)
Problem: Discharge Progression Outcomes Goal: Vascular site scale level 0 - I Vascular Site Scale Level 0: No bruising/bleeding/hematoma Level I (Mild): Bruising/Ecchymosis, minimal bleeding/ooozing, palpable hematoma < 3 cm Level II (Moderate): Bleeding not affecting hemodynamic parameters, pseudoaneurysm, palpable hematoma > 3 cm  Outcome: Completed/Met Date Met:  08/25/12 Level 0

## 2013-03-31 ENCOUNTER — Ambulatory Visit (INDEPENDENT_AMBULATORY_CARE_PROVIDER_SITE_OTHER): Payer: Self-pay | Admitting: Neurology

## 2013-03-31 VITALS — BP 143/73 | HR 72 | Wt 161.2 lb

## 2013-03-31 DIAGNOSIS — I6990 Unspecified sequelae of unspecified cerebrovascular disease: Secondary | ICD-10-CM

## 2013-03-31 NOTE — Progress Notes (Signed)
Patient seen today for Month 6 f/u visit in the GORE Reduce Study.  Conmeds and AEs were reviewed with pt.  No changes since last visit here.  Vital signs and EKG were performed per protocol.  PE, NIHSS and MRS completed by Dr. Pearlean Brownie.   NIHSS zero and MRS 1

## 2013-06-27 ENCOUNTER — Encounter: Payer: Self-pay | Admitting: Neurology

## 2013-08-24 ENCOUNTER — Ambulatory Visit (INDEPENDENT_AMBULATORY_CARE_PROVIDER_SITE_OTHER): Payer: Self-pay | Admitting: *Deleted

## 2013-08-24 DIAGNOSIS — I639 Cerebral infarction, unspecified: Secondary | ICD-10-CM

## 2013-08-24 DIAGNOSIS — I6350 Cerebral infarction due to unspecified occlusion or stenosis of unspecified cerebral artery: Secondary | ICD-10-CM

## 2013-08-24 NOTE — Progress Notes (Signed)
Patient came in for the 12 month follow up in the GORE Reduce trial. No reported AE's or SAE's since last office visit. Patient states that he has been doing well. Dr. Terrace Arabia performed the neuro and physical exam. No neurological deficits were noted. Patient will be scheduled for trial related TTE with bubble study per Dr. Jacinto Halim. Patient continues continues take study required Aspirin 81mg  daily.

## 2013-09-12 ENCOUNTER — Encounter (HOSPITAL_COMMUNITY): Payer: Self-pay | Admitting: Pharmacy Technician

## 2013-09-13 ENCOUNTER — Ambulatory Visit (HOSPITAL_COMMUNITY)
Admission: RE | Admit: 2013-09-13 | Discharge: 2013-09-13 | Disposition: A | Discharge status: Home / Self Care | Payer: BC Managed Care – PPO | Source: Ambulatory Visit | Attending: Cardiology | Admitting: Cardiology

## 2013-09-13 ENCOUNTER — Encounter (HOSPITAL_COMMUNITY)
Admission: RE | Disposition: A | Discharge status: Home / Self Care | Payer: Self-pay | Source: Ambulatory Visit | Attending: Cardiology

## 2013-09-13 DIAGNOSIS — F1210 Cannabis abuse, uncomplicated: Secondary | ICD-10-CM | POA: Insufficient documentation

## 2013-09-13 DIAGNOSIS — Z006 Encounter for examination for normal comparison and control in clinical research program: Secondary | ICD-10-CM | POA: Insufficient documentation

## 2013-09-13 DIAGNOSIS — Z87891 Personal history of nicotine dependence: Secondary | ICD-10-CM | POA: Insufficient documentation

## 2013-09-13 DIAGNOSIS — E785 Hyperlipidemia, unspecified: Secondary | ICD-10-CM | POA: Insufficient documentation

## 2013-09-13 DIAGNOSIS — Z95818 Presence of other cardiac implants and grafts: Secondary | ICD-10-CM | POA: Insufficient documentation

## 2013-09-13 DIAGNOSIS — Z09 Encounter for follow-up examination after completed treatment for conditions other than malignant neoplasm: Secondary | ICD-10-CM | POA: Insufficient documentation

## 2013-09-13 DIAGNOSIS — R29898 Other symptoms and signs involving the musculoskeletal system: Secondary | ICD-10-CM | POA: Insufficient documentation

## 2013-09-13 DIAGNOSIS — Z79899 Other long term (current) drug therapy: Secondary | ICD-10-CM | POA: Insufficient documentation

## 2013-09-13 DIAGNOSIS — Z7982 Long term (current) use of aspirin: Secondary | ICD-10-CM | POA: Insufficient documentation

## 2013-09-13 DIAGNOSIS — Z9889 Other specified postprocedural states: Secondary | ICD-10-CM | POA: Insufficient documentation

## 2013-09-13 DIAGNOSIS — I69998 Other sequelae following unspecified cerebrovascular disease: Secondary | ICD-10-CM | POA: Insufficient documentation

## 2013-09-13 DIAGNOSIS — I10 Essential (primary) hypertension: Secondary | ICD-10-CM | POA: Insufficient documentation

## 2013-09-13 HISTORY — PX: PATENT FORAMEN OVALE CLOSURE: SHX5483

## 2013-09-13 SURGERY — PATENT FORAMEN OVALE CLOSURE
Anesthesia: LOCAL

## 2013-09-13 NOTE — H&P (Signed)
Please see office visit notes for complete details of HPI.  Rodney Page is an 52 y.o. male.   Chief Complaint: Old Stroke HPI: Patient is a 52 year old African American male with history of hypertension and hyperlipidemia who has had a stroke and underwent successful closure of the PFO under reduce protocol. He is now brought for fluoroscopy as a part of protocol evaluation. No other complaints.  Past Medical History  Diagnosis Date  . Hypertension   . Hyperlipidemia   . 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 Stroke     possible stroke 2009 per wife  . Stroke 02/2012    residual left sided weakness (leg); "said he's had lots of min strokes before this too" (08/24/2012)  . Brain aneurysm     right side of his brain; found 02/2012    Past Surgical History  Procedure Laterality Date  . Tee without cardioversion  02/13/2012    Procedure: TRANSESOPHAGEAL ECHOCARDIOGRAM (TEE);  Surgeon: Vesta Mixer, MD;  Location: [facility] ENDOSCOPY;  Service: Cardiovascular;  Laterality: N/A;  . Patent foramen ovale closure  08/24/2012    Family History  Problem Relation Age of Onset  . Heart disease Mother   . Hypertension Sister   . Hypertension Brother    Social History:  reports that he quit smoking about 15 years ago. His smoking use included Cigarettes. He has a 20 pack-year smoking history. He has never used smokeless tobacco. He reports that he uses illicit drugs (Marijuana). He reports that he does not drink alcohol.  Allergies:  Allergies  Allergen Reactions  . Iodinated Diagnostic Agents Other (See Comments)    MRI DYE; "face and skin; broke out in hives and swelling"    Medications Prior to Admission  Medication Sig Dispense Refill  . amLODipine (NORVASC) 10 MG tablet Take 10 mg by mouth daily.      Marland Kitchen aspirin 81 MG tablet Take 81 mg by mouth daily.      . Multiple Vitamin (MULITIVITAMIN WITH MINERALS) TABS Take 1 tablet by mouth daily.         Blood pressure 146/80,  pulse 62, temperature 98 F (36.7 C), temperature source Oral, resp. rate 18, height 6' (1.829 m), weight 73.936 kg (163 lb), SpO2 93.00%. General appearance: alert, cooperative and appears stated age Eyes: negative findings: lids and lashes normal Neck: no adenopathy, no carotid bruit, no JVD, supple, symmetrical, trachea midline and thyroid not enlarged, symmetric, no tenderness/mass/nodules Neck: JVP - normal, carotids 2+= without bruits Resp: clear to auscultation bilaterally Chest wall: no tenderness Cardio: regular rate and rhythm, S1, S2 normal, no murmur, click, rub or gallop GI: soft, non-tender; bowel sounds normal; no masses,  no organomegaly Extremities: extremities normal, atraumatic, no cyanosis or edema Pulses: 2+ and symmetric Skin: Skin color, texture, turgor normal. No rashes or lesions Neurologic: Grossly normal  No results found for this or any previous visit (from the past 48 hour(s)). No results found.  Labs:   Lab Results  Component Value Date   WBC 6.3 02/11/2012   HGB 16.0 02/11/2012   HCT 47.0 02/11/2012   MCV 85.6 02/11/2012   PLT 251 02/11/2012   No results found for this basename: NA, K, CL, CO2, BUN, CREATININE, CALCIUM, LABALBU, PROT, BILITOT, ALKPHOS, ALT, AST, GLUCOSE,  in the last 168 hours Lab Results  Component Value Date   TROPONINI <0.30 02/11/2012    Lipid Panel     Component Value Date/Time   CHOL 132  02/12/2012 0535   TRIG 98 02/12/2012 0535   HDL 59 02/12/2012 0535   CHOLHDL 2.2 02/12/2012 0535   VLDL 20 02/12/2012 0535   LDLCALC 53 02/12/2012 0535    Assessment/Plan 1. History of stroke without any residual defects 2. Hypertension  Pamella Pert, MD 09/13/2013, 9:09 AM [facility] Pager: [phone] Office: [phone] If no answer: Cell:  [phone]

## 2013-09-13 NOTE — CV Procedure (Signed)
Procedure performed: Fluoroscopy  Patient is a 52 year old African American male with history of stroke in the past and has a PFO and underwent successful PFO closure under "REDUCE" secondary stroke prevention trial with implantation of a Helex septal occluder. As a part of the protocol, he was brought in for fluoroscopy to evaluate for stent strut fracture.  Findings: Device position excellent. No evidence of fracture.

## 2014-02-28 ENCOUNTER — Encounter (INDEPENDENT_AMBULATORY_CARE_PROVIDER_SITE_OTHER): Payer: Self-pay

## 2014-02-28 DIAGNOSIS — Z0289 Encounter for other administrative examinations: Secondary | ICD-10-CM

## 2014-08-16 ENCOUNTER — Other Ambulatory Visit: Payer: Self-pay | Admitting: Neurology

## 2014-08-16 DIAGNOSIS — I6350 Cerebral infarction due to unspecified occlusion or stenosis of unspecified cerebral artery: Secondary | ICD-10-CM

## 2014-08-24 ENCOUNTER — Ambulatory Visit (INDEPENDENT_AMBULATORY_CARE_PROVIDER_SITE_OTHER): Payer: Self-pay | Admitting: Neurology

## 2014-08-24 VITALS — BP 124/60 | HR 72 | Wt 155.0 lb

## 2014-08-24 DIAGNOSIS — Z0289 Encounter for other administrative examinations: Secondary | ICD-10-CM

## 2014-08-24 DIAGNOSIS — I6990 Unspecified sequelae of unspecified cerebrovascular disease: Secondary | ICD-10-CM

## 2014-08-24 NOTE — Progress Notes (Signed)
Patient came in for the 24 month follow up in the GORE Reduce trial. No reported AE's or SAE's since last office visit. Patient states that he has been doing well. Dr. Pearlean Brownie performed the neuro and physical exam. No neurological deficits were noted. Patient is scheduled for trial related TEE and MRI. Patient continues to take study required Aspirin  daily.

## 2014-08-25 NOTE — Progress Notes (Signed)
I agree with the above plan 

## 2014-08-28 ENCOUNTER — Ambulatory Visit (HOSPITAL_COMMUNITY)
Admission: RE | Admit: 2014-08-28 | Discharge: 2014-08-28 | Disposition: A | Discharge status: Home / Self Care | Payer: BC Managed Care – PPO | Source: Ambulatory Visit | Attending: Neurology | Admitting: Neurology

## 2014-08-28 DIAGNOSIS — I6350 Cerebral infarction due to unspecified occlusion or stenosis of unspecified cerebral artery: Secondary | ICD-10-CM

## 2014-09-04 ENCOUNTER — Encounter (HOSPITAL_COMMUNITY): Payer: Self-pay | Admitting: Pharmacy Technician

## 2014-09-05 ENCOUNTER — Encounter (HOSPITAL_COMMUNITY): Payer: Self-pay | Admitting: *Deleted

## 2014-09-05 ENCOUNTER — Ambulatory Visit (HOSPITAL_COMMUNITY)
Admission: RE | Admit: 2014-09-05 | Discharge: 2014-09-05 | Disposition: A | Discharge status: Home / Self Care | Payer: BC Managed Care – PPO | Source: Ambulatory Visit | Attending: Cardiology | Admitting: Cardiology

## 2014-09-05 ENCOUNTER — Encounter (HOSPITAL_COMMUNITY)
Admission: RE | Disposition: A | Discharge status: Home / Self Care | Payer: Self-pay | Source: Ambulatory Visit | Attending: Cardiology

## 2014-09-05 DIAGNOSIS — Z8774 Personal history of (corrected) congenital malformations of heart and circulatory system: Secondary | ICD-10-CM | POA: Insufficient documentation

## 2014-09-05 DIAGNOSIS — Z9889 Other specified postprocedural states: Secondary | ICD-10-CM | POA: Insufficient documentation

## 2014-09-05 HISTORY — PX: TEE WITHOUT CARDIOVERSION: SHX5443

## 2014-09-05 SURGERY — ECHOCARDIOGRAM, TRANSESOPHAGEAL
Anesthesia: Moderate Sedation

## 2014-09-05 MED ORDER — BUTAMBEN-TETRACAINE-BENZOCAINE 2-2-14 % EX AERO
INHALATION_SPRAY | CUTANEOUS | Status: DC | PRN
  Administered 2014-09-05: 2 via TOPICAL

## 2014-09-05 MED ORDER — FENTANYL CITRATE 0.05 MG/ML IJ SOLN
INTRAMUSCULAR | Status: DC | PRN
  Administered 2014-09-05 (×2): 25 ug via INTRAVENOUS

## 2014-09-05 MED ORDER — FENTANYL CITRATE 0.05 MG/ML IJ SOLN
INTRAMUSCULAR | Status: AC
  Filled 2014-09-05: qty 2

## 2014-09-05 MED ORDER — SODIUM CHLORIDE 0.9 % IV SOLN
INTRAVENOUS | Status: DC
  Administered 2014-09-05: 500 mL via INTRAVENOUS

## 2014-09-05 MED ORDER — MIDAZOLAM HCL 5 MG/ML IJ SOLN
INTRAMUSCULAR | Status: AC
  Filled 2014-09-05: qty 2

## 2014-09-05 MED ORDER — MIDAZOLAM HCL 10 MG/2ML IJ SOLN
INTRAMUSCULAR | Status: DC | PRN
  Administered 2014-09-05: 2 mg via INTRAVENOUS

## 2014-09-05 NOTE — H&P (View-Only) (Signed)
I agree with the above plan 

## 2014-09-05 NOTE — Progress Notes (Signed)
  Echocardiogram Echocardiogram Transesophageal has been performed.  Naven Giambalvo FRANCES 09/05/2014, 12:15 PM

## 2014-09-05 NOTE — Interval H&P Note (Signed)
History and Physical Interval Note:  09/05/2014 10:41 AM  Rodney Page  has presented today for surgery, with the diagnosis of PFO CLOSURE  The various methods of treatment have been discussed with the patient and family. After consideration of risks, benefits and other options for treatment, the patient has consented to  Procedure(s): TRANSESOPHAGEAL ECHOCARDIOGRAM (TEE) (N/A) as a surgical intervention .  The patient's history has been reviewed, patient examined, no change in status, stable for surgery.  I have reviewed the patient's chart and labs.  Questions were answered to the patient's satisfaction.     Pamella PertGANJI,JAGADEESH R

## 2014-09-05 NOTE — CV Procedure (Signed)
TEE: Under moderate sedation, TEE was performed without complications: LV: Normal. Normal EF. RV: Normal LA: Normal. Left atrial appendage: Normal without thrombus. Normal function. Inter atrial septum is intact without residual defect. There is an Amplatzer ASO device which is in good position. No thrombus on the device. Double contrast study negative for atrial level shunting with and without Valsalva. No late appearance of bubbles either. RA: Normal  MV: Normal Trace MR. TV: Normal Trace TR AV: Normal. No AI or AS. PV: Normal. Trace PI.  Thoracic and ascending aorta: Normal, minimal mixed plaque at the sino-tubular junction.

## 2014-09-06 ENCOUNTER — Encounter (HOSPITAL_COMMUNITY): Payer: Self-pay | Admitting: Cardiology

## 2014-09-26 ENCOUNTER — Telehealth: Payer: Self-pay | Admitting: Neurology

## 2014-09-26 NOTE — Telephone Encounter (Signed)
I received a phone call back from [employer] Arvin Collard(Kelly Tucker and her co-worker) stating that the patient's MR Brain on 08/28/14 was not billed to patient but to the study.  The patient's TEE done on 09/05/14 was not charged to the study in error, therefore the patient was receiving a bill and phone calls for this account.  I informed her that [facility] well.  She will take care of this.

## 2014-09-26 NOTE — Telephone Encounter (Signed)
Patient called and stated that he was getting [facility] a [facility] for his MR Brain done on 08/28/14 for the [employer] study.  He stated that he had received 3 phone calls saying that he would be turned into collections. I apologized to the patient for this inconvenience and the phone calls.   I informed patient that I would call billing and let them know that he should not be charged for this.  Billing department referred me to Arvin CollardKelly Tucker in [employer] who is looking into what is going on with this account and she will let me know.  I called patient back and informed him that this will be taken care of.  I also apologized again per the request of Arvin CollardKelly Tucker.  Patient understood and had no other questions.

## 2014-11-16 ENCOUNTER — Encounter (HOSPITAL_COMMUNITY): Payer: Self-pay | Admitting: Cardiology

## 2015-01-29 ENCOUNTER — Telehealth: Payer: Self-pay

## 2015-01-29 NOTE — Telephone Encounter (Signed)
ERROR

## 2015-01-29 NOTE — Progress Notes (Signed)
Subject needed a note to stay off from work. Could not tell specific reason . Discussed with Dr. Marjory LiesPenumalli and he advised to contact the PCP.

## 2015-02-08 ENCOUNTER — Telehealth: Payer: Self-pay

## 2015-02-08 ENCOUNTER — Encounter: Payer: Self-pay | Admitting: Adult Health

## 2015-02-08 ENCOUNTER — Ambulatory Visit (INDEPENDENT_AMBULATORY_CARE_PROVIDER_SITE_OTHER): Payer: BLUE CROSS/BLUE SHIELD | Admitting: Adult Health

## 2015-02-08 VITALS — BP 142/87 | HR 76 | Ht 72.0 in | Wt 160.0 lb

## 2015-02-08 DIAGNOSIS — F4322 Adjustment disorder with anxiety: Secondary | ICD-10-CM | POA: Diagnosis not present

## 2015-02-08 DIAGNOSIS — Z8673 Personal history of transient ischemic attack (TIA), and cerebral infarction without residual deficits: Secondary | ICD-10-CM

## 2015-02-08 MED ORDER — CITALOPRAM HYDROBROMIDE 10 MG PO TABS: 10.0000 mg | ORAL_TABLET | Freq: Every day | ORAL | Status: DC

## 2015-02-08 MED ORDER — ALPRAZOLAM 0.25 MG PO TABS: 0.2500 mg | ORAL_TABLET | Freq: Two times a day (BID) | ORAL | Status: DC | PRN

## 2015-02-08 NOTE — Telephone Encounter (Signed)
Rodney Page tired to call she left message. [name] has information she need's.

## 2015-02-08 NOTE — Patient Instructions (Signed)
Begin Xanax- take 0.25 mg twice a day as needed for anxiety Begin Celexa 10 mg daily for anxiety and stress. If you have any stroke like symptoms please call 911.   Citalopram tablets What is this medicine? CITALOPRAM (sye TAL oh pram) is a medicine for depression. This medicine may be used for other purposes; ask your health care provider or pharmacist if you have questions. COMMON BRAND NAME(S): Celexa What should I tell my health care provider before I take this medicine? They need to know if you have any of these conditions: -bipolar disorder or a family history of bipolar disorder -diabetes -glaucoma -heart disease -history of irregular heartbeat -kidney or liver disease -low levels of magnesium or potassium in the blood -receiving electroconvulsive therapy -seizures (convulsions) -suicidal thoughts or a previous suicide attempt -an unusual or allergic reaction to citalopram, escitalopram, other medicines, foods, dyes, or preservatives -pregnant or trying to become pregnant -breast-feeding How should I use this medicine? Take this medicine by mouth with a glass of water. Follow the directions on the prescription label. You can take it with or without food. Take your medicine at regular intervals. Do not take your medicine more often than directed. Do not stop taking this medicine suddenly except upon the advice of your doctor. Stopping this medicine too quickly may cause serious side effects or your condition may worsen. A special MedGuide will be given to you by the pharmacist with each prescription and refill. Be sure to read this information carefully each time. Talk to your pediatrician regarding the use of this medicine in children. Special care may be needed. Patients over 56 years old may have a stronger reaction and need a smaller dose. Overdosage: If you think you have taken too much of this medicine contact a poison control center or emergency room at once. NOTE: This  medicine is only for you. Do not share this medicine with others. What if I miss a dose? If you miss a dose, take it as soon as you can. If it is almost time for your next dose, take only that dose. Do not take double or extra doses. What may interact with this medicine? Do not take this medicine with any of the following medications: -certain medicines for fungal infections like fluconazole, itraconazole, ketoconazole, posaconazole, voriconazole -cisapride -dofetilide -dronedarone -escitalopram -linezolid -MAOIs like Carbex, Eldepryl, Marplan, Nardil, and Parnate -methylene blue (injected into a vein) -pimozide -thioridazine -ziprasidone This medicine may also interact with the following medications: -alcohol -aspirin and aspirin-like medicines -carbamazepine -certain medicines for depression, anxiety, or psychotic disturbances -certain medicines for infections like chloroquine, clarithromycin, erythromycin, furazolidone, isoniazid, pentamidine -certain medicines for migraine headaches like almotriptan, eletriptan, frovatriptan, naratriptan, rizatriptan, sumatriptan, zolmitriptan -certain medicines for sleep -certain medicines that treat or prevent blood clots like dalteparin, enoxaparin, warfarin -cimetidine -diuretics -fentanyl -lithium -methadone -metoprolol -NSAIDs, medicines for pain and inflammation, like ibuprofen or naproxen -omeprazole -other medicines that prolong the QT interval (cause an abnormal heart rhythm) -procarbazine -rasagiline -supplements like St. John's wort, kava kava, valerian -tramadol -tryptophan This list may not describe all possible interactions. Give your health care provider a list of all the medicines, herbs, non-prescription drugs, or dietary supplements you use. Also tell them if you smoke, drink alcohol, or use illegal drugs. Some items may interact with your medicine. What should I watch for while using this medicine? Tell your doctor if  your symptoms do not get better or if they get worse. Visit your doctor or health care professional for regular  checks on your progress. Because it may take several weeks to see the full effects of this medicine, it is important to continue your treatment as prescribed by your doctor. Patients and their families should watch out for new or worsening thoughts of suicide or depression. Also watch out for sudden changes in feelings such as feeling anxious, agitated, panicky, irritable, hostile, aggressive, impulsive, severely restless, overly excited and hyperactive, or not being able to sleep. If this happens, especially at the beginning of treatment or after a change in dose, call your health care professional. Bonita QuinYou may get drowsy or dizzy. Do not drive, use machinery, or do anything that needs mental alertness until you know how this medicine affects you. Do not stand or sit up quickly, especially if you are an older patient. This reduces the risk of dizzy or fainting spells. Alcohol may interfere with the effect of this medicine. Avoid alcoholic drinks. Your mouth may get dry. Chewing sugarless gum or sucking hard candy, and drinking plenty of water will help. Contact your doctor if the problem does not go away or is severe. What side effects may I notice from receiving this medicine? Side effects that you should report to your doctor or health care professional as soon as possible: -allergic reactions like skin rash, itching or hives, swelling of the face, lips, or tongue -chest pain -confusion -dizziness -fast, irregular heartbeat -fast talking and excited feelings or actions that are out of control -feeling faint or lightheaded, falls -hallucination, loss of contact with reality -seizures -shortness of breath -suicidal thoughts or other mood changes -unusual bleeding or bruising Side effects that usually do not require medical attention (report to your doctor or health care professional if they  continue or are bothersome): -blurred vision -change in appetite -change in sex drive or performance -headache -increased sweating -nausea -trouble sleeping This list may not describe all possible side effects. Call your doctor for medical advice about side effects. You may report side effects to FDA at 1-800-FDA-1088. Where should I keep my medicine? Keep out of reach of children. Store at room temperature between 15 and 30 degrees C (59 and 86 degrees F). Throw away any unused medicine after the expiration date. NOTE: This sheet is a summary. It may not cover all possible information. If you have questions about this medicine, talk to your doctor, pharmacist, or health care provider.  2015, Elsevier/Gold Standard. (2013-06-17 13:19:48) Alprazolam tablets What is this medicine? ALPRAZOLAM (al PRAY zoe lam) is a benzodiazepine. It is used to treat anxiety and panic attacks. This medicine may be used for other purposes; ask your health care provider or pharmacist if you have questions. COMMON BRAND NAME(S): Xanax What should I tell my health care provider before I take this medicine? They need to know if you have any of these conditions: -an alcohol or drug abuse problem -bipolar disorder, depression, psychosis or other mental health conditions -glaucoma -kidney or liver disease -lung or breathing disease -myasthenia gravis -Parkinson's disease -porphyria -seizures or a history of seizures -suicidal thoughts -an unusual or allergic reaction to alprazolam, other benzodiazepines, foods, dyes, or preservatives -pregnant or trying to get pregnant -breast-feeding How should I use this medicine? Take this medicine by mouth with a glass of water. Follow the directions on the prescription label. Take your medicine at regular intervals. Do not take it more often than directed. If you have been taking this medicine regularly for some time, do not suddenly stop taking it. You must gradually  reduce  the dose or you may get severe side effects. Ask your doctor or health care professional for advice. Even after you stop taking this medicine it can still affect your body for several days. Talk to your pediatrician regarding the use of this medicine in children. Special care may be needed. Overdosage: If you think you have taken too much of this medicine contact a poison control center or emergency room at once. NOTE: This medicine is only for you. Do not share this medicine with others. What if I miss a dose? If you miss a dose, take it as soon as you can. If it is almost time for your next dose, take only that dose. Do not take double or extra doses. What may interact with this medicine? Do not take this medicine with any of the following medications: -certain medicines for HIV infection or AIDS -ketoconazole -itraconazole This medicine may also interact with the following medications: -birth control pills -certain macrolide antibiotics like clarithromycin, erythromycin, troleandomycin -cimetidine -cyclosporine -ergotamine -grapefruit juice -herbal or dietary supplements like kava kava, melatonin, dehydroepiandrosterone, DHEA, St. John's Wort or valerian -imatinib, STI-571 -isoniazid -levodopa -medicines for depression, anxiety, or psychotic disturbances -prescription pain medicines -rifampin, rifapentine, or rifabutin -some medicines for blood pressure or heart problems -some medicines for seizures like carbamazepine, oxcarbazepine, phenobarbital, phenytoin, primidone This list may not describe all possible interactions. Give your health care provider a list of all the medicines, herbs, non-prescription drugs, or dietary supplements you use. Also tell them if you smoke, drink alcohol, or use illegal drugs. Some items may interact with your medicine. What should I watch for while using this medicine? Visit your doctor or health care professional for regular checks on your  progress. Your body can become dependent on this medicine. Ask your doctor or health care professional if you still need to take it. You may get drowsy or dizzy. Do not drive, use machinery, or do anything that needs mental alertness until you know how this medicine affects you. To reduce the risk of dizzy and fainting spells, do not stand or sit up quickly, especially if you are an older patient. Alcohol may increase dizziness and drowsiness. Avoid alcoholic drinks. Do not treat yourself for coughs, colds or allergies without asking your doctor or health care professional for advice. Some ingredients can increase possible side effects. What side effects may I notice from receiving this medicine? Side effects that you should report to your doctor or health care professional as soon as possible: -allergic reactions like skin rash, itching or hives, swelling of the face, lips, or tongue -confusion, forgetfulness -depression -difficulty sleeping -difficulty speaking -feeling faint or lightheaded, falls -mood changes, excitability or aggressive behavior -muscle cramps -trouble passing urine or change in the amount of urine -unusually weak or tired Side effects that usually do not require medical attention (report to your doctor or health care professional if they continue or are bothersome): -change in sex drive or performance -changes in appetite This list may not describe all possible side effects. Call your doctor for medical advice about side effects. You may report side effects to FDA at 1-800-FDA-1088. Where should I keep my medicine? Keep out of the reach of children. This medicine can be abused. Keep your medicine in a safe place to protect it from theft. Do not share this medicine with anyone. Selling or giving away this medicine is dangerous and against the law. Store at room temperature between 20 and 25 degrees C (68 and 77 degrees F).  Throw away any unused medicine after the expiration  date. NOTE: This sheet is a summary. It may not cover all possible information. If you have questions about this medicine, talk to your doctor, pharmacist, or health care provider.  2015, Elsevier/Gold Standard. (2008-02-17 10:34:46)

## 2015-02-08 NOTE — Telephone Encounter (Signed)
I called and spoke to Dr. Clarene DukeLittle. I explained what was going on with Mr. Rodney Page. We have faxed him our office note for review.

## 2015-02-08 NOTE — Telephone Encounter (Signed)
Megan spoke to Dr. Caryn BeeKevin - Faxed office note to Dr.Little /Jane

## 2015-02-08 NOTE — Progress Notes (Signed)
PATIENT: Rodney Page DOB: Jun 12, 1961  REASON FOR VISIT: follow up- history of CVA, anxiety HISTORY FROM: patient  HISTORY OF PRESENT ILLNESS: Mr. Rodney Page is a 54 year old male with a history of a right posterior limb internal capsule infarct. He returns today for follow-up. The patient is currently taking aspirin for stroke prevention. The patient had a TEE that showed that he had a PFO. The patient did undergo surgery for closure of the PFO. Patient's blood pressure has been under relatively good control. Today it is 142/87. He continues to take Norvasc daily. Patient states that his primary care provider recently checked his cholesterol levels and he was told it was normal. He recently had a physical exam in December. Patient states that he is overwhelmed at work which is causing stress and anxiety. He states that his primary care take him out of work for one week but this is not sufficient at this time. Looking to find another job but has not been successful this far. He states that he is not sleeping well. He states that his appetite has changed. He reports that his wife feels that his speech is slurred at times. He denies any weakness- states he occasionally may have some numbness in the hands bilaterally. Denies facial droop. Denies any problems with his walking or balance. Denies any changes in his vision. Dr. Pearlean BrownieSethi spoke to the patient's wife over the phone. She confirmed that she feels that the patient is anxious and overwhelmed with work and that is causing some of his symptoms. The patient's geriatric depression scale score is 2.  REVIEW OF SYSTEMS: Out of a complete 14 system review of symptoms, the patient complains only of the following symptoms, and all other reviewed systems are negative.  Anxious   ALLERGIES: Allergies  Allergen Reactions  . Iodinated Diagnostic Agents Other (See Comments)    MRI DYE; "face and skin; broke out in hives and swelling"    HOME  MEDICATIONS: Outpatient Prescriptions Prior to Visit  Medication Sig Dispense Refill  . amLODipine (NORVASC) 10 MG tablet Take 10 mg by mouth daily.    Marland Kitchen. aspirin 81 MG tablet Take 81 mg by mouth daily.    . Multiple Vitamin (MULITIVITAMIN WITH MINERALS) TABS Take 1 tablet by mouth daily.     No facility-administered medications prior to visit.    PAST MEDICAL HISTORY: Past Medical History  Diagnosis Date  . Hypertension   . Hyperlipidemia   . 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 Stroke     possible stroke 2009 per wife  . Stroke 02/2012    residual left sided weakness (leg); "said he's had lots of min strokes before this too" (08/24/2012)  . Brain aneurysm     right side of his brain; found 02/2012    PAST SURGICAL HISTORY: Past Surgical History  Procedure Laterality Date  . Tee without cardioversion  02/13/2012    Procedure: TRANSESOPHAGEAL ECHOCARDIOGRAM (TEE);  Surgeon: Vesta MixerPhilip J Nahser, MD;  [facility];  Service: Cardiovascular;  Laterality: N/A;  . Patent foramen ovale closure  08/24/2012  . Tee without cardioversion N/A 09/05/2014    Procedure: TRANSESOPHAGEAL ECHOCARDIOGRAM (TEE);  Surgeon: Pamella PertJagadeesh R Ganji, MD;  [facility];  Service: Cardiovascular;  Laterality: N/A;  . Patent foramen ovale closure Bilateral 08/24/2012    Procedure: PATENT FORAMEN OVALE CLOSURE;  Surgeon: Pamella PertJagadeesh R Ganji, MD;  [facility];  Service: Cardiovascular;  Laterality: Bilateral;  . Patent foramen ovale  closure N/A 09/13/2013    Procedure: Horace Porteous;  Surgeon: Pamella Pert, MD;  Location[facility] CATH LAB;  Service: Cardiovascular;  Laterality: N/A;    FAMILY HISTORY: Family History  Problem Relation Age of Onset  . Heart disease Mother   . Hypertension Sister   . Hypertension Brother     PHYSICAL EXAM  Filed Vitals:   02/08/15 0858  BP: 142/87  Pulse: 76  Height: 6' (1.829 m)  Weight: 160 lb (72.576 kg)   Body mass index is 21.7  kg/(m^2).  Generalized: Well developed, in no acute distress   Neurological examination  Mentation: Alert oriented to time, place, history taking. Follows all commands speech and language fluent Cranial nerve II-XII: Pupils were equal round reactive to light. Extraocular movements were full, visual field were full on confrontational test. Facial sensation and strength were normal. Uvula tongue midline. Head turning and shoulder shrug  were normal and symmetric. Motor: The motor testing reveals 5 over 5 strength of all 4 extremities. Good symmetric motor tone is noted throughout. Rapid alternating movements normal. Sensory: Sensory testing is intact to soft touch on all 4 extremities. No evidence of extinction is noted.  Coordination: Cerebellar testing reveals good finger-nose-finger and heel-to-shin bilaterally.  Gait and station: Gait is normal. Tandem gait is normal. Romberg is negative. No drift is seen.  Reflexes: Deep tendon reflexes are symmetric and normal bilaterally.    DIAGNOSTIC DATA (LABS, IMAGING, TESTING) - I reviewed patient records, labs, notes, testing and imaging myself where available.    ASSESSMENT AND PLAN 54 y.o. year old male  has a past medical history of Hypertension; Hyperlipidemia; Seizure (1998); Stroke; Stroke (02/2012); and Brain aneurysm. here with:  1. History of CVA 2. Anxiety  It seems that most of the patient's symptoms stem from anxiety due to his current work situation. At this time the patient does not have any strokelike symptoms. I consulted with Dr. Pearlean Brownie  at this time we'll start the patient on Xanax 0.25 mg twice a day as needed for anxiety. He will also be started on Celexa 10 mg daily. I have reviewed with the patient in detail the side effects of Xanax and Celexa. We will also write for the patient to be out of work for 2 weeks due to severe anxiety and for his medications to be initiated. The patient will follow-up with his primary care provider  for any ongoing issues with anxiety. The patient was advised that if he experiences any strokelike symptoms to call 911 immediately. The patient verbalized understanding. He will follow-up in 6 months or sooner if needed.  Dr. Pearlean Brownie did come in and examine the patient and guided the plan of care.   Butch Penny, MSN, NP-C 02/08/2015, 9:01 AM [facility] [address] [place], [place] [address] [phone]  Note: This document was prepared with digital dictation and possible smart phrase technology. Any transcriptional errors that result from this process are unintentional.

## 2015-02-12 NOTE — Progress Notes (Signed)
I agree with the above plan. I think the patient has significant underlying anxiety and hence starting him on low-dose anxiolytic medications may be beneficial.

## 2015-08-28 ENCOUNTER — Telehealth: Payer: Self-pay

## 2015-08-28 NOTE — Telephone Encounter (Signed)
Called the subject for 36 Month phone call. Gore Helix trial

## 2016-03-31 IMAGING — MR MR HEAD W/O CM
9 of 10 series · 31 of 48 positions shown · non-contrast
Comparison: 02/11/2012.

ADDENDUM:
Study discussed by telephone with Dr. PRAMOD LACY on 08/30/2014 at
8928 hrs. The tiny left cerebellar infarct was present in 6603 and
is stable, might be more conspicuous today due to 3 Tesla imaging.
CLINICAL DATA: 52-year-old male with history of high blood pressure
and hyperlipidemia. Prior stroke with successful closure of PFO.
Research protocol followup. Aneurysm. Seizure.

EXAM:
MRI HEAD WITHOUT CONTRAST
TECHNIQUE: Multiplanar, multiecho pulse sequences of the brain and surrounding
structures were obtained without intravenous contrast.

[Series 2: FLAIR · sagittal · 5.0mm · 0.47mm/px · 2 of 23 slices shown (1 of 2)]
[im 1/23]
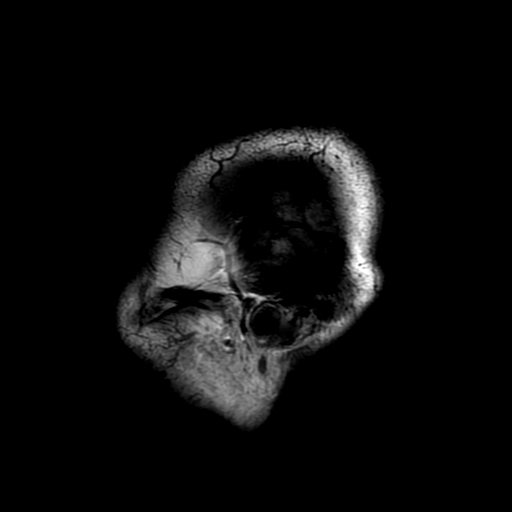
[im 23/23]
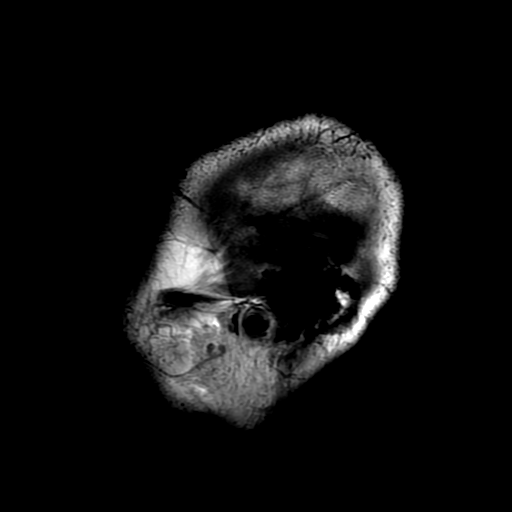

[Series 4: DWI · axial · 5.0mm · 1.02mm/px · z∈[-130,+11]mm · 5 of 62 slices shown (1 of 4)]
[im 1/62]
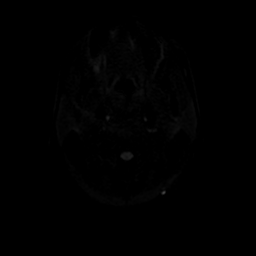
[im 16/62]
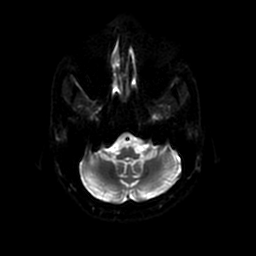
[im 31/62]
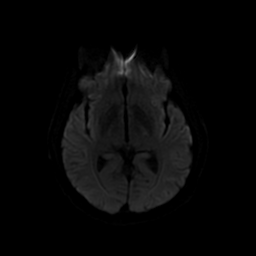
[im 46/62]
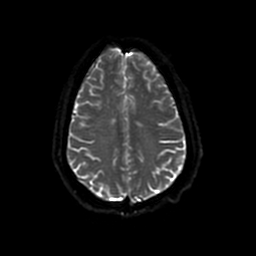
[im 62/62]
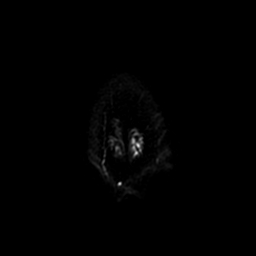

[Series 5: T2 · axial · 5.0mm · 0.43mm/px · z∈[-123,+18]mm · 2 of 26 slices shown (1 of 2)]
[im 1/26]
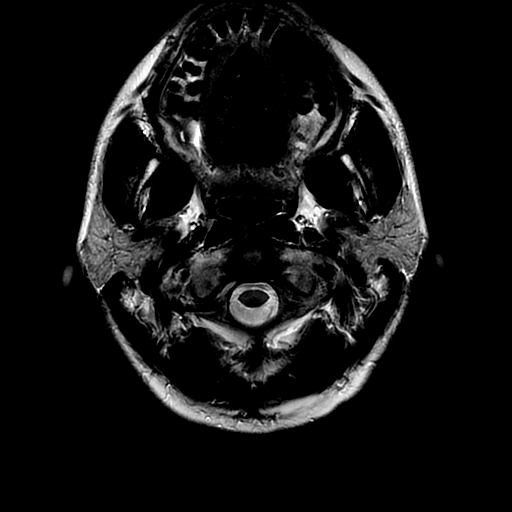
[im 26/26]
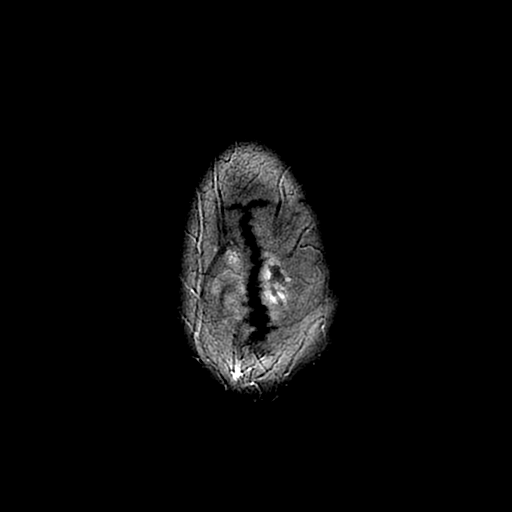

[Series 6: FLAIR · axial · 5.0mm · 0.43mm/px · z∈[-123,+18]mm · 2 of 26 slices shown (2 of 2)]
[im 1/26]
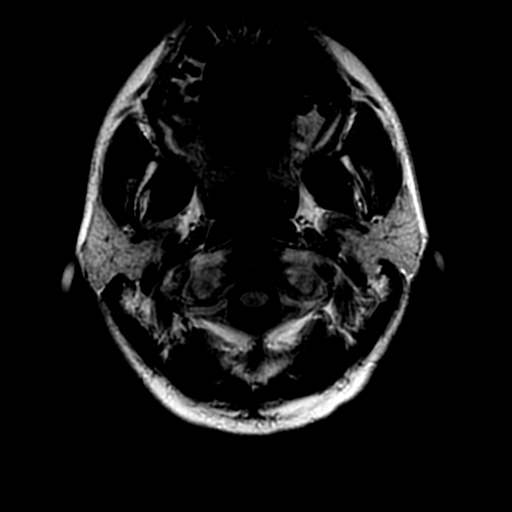
[im 26/26]
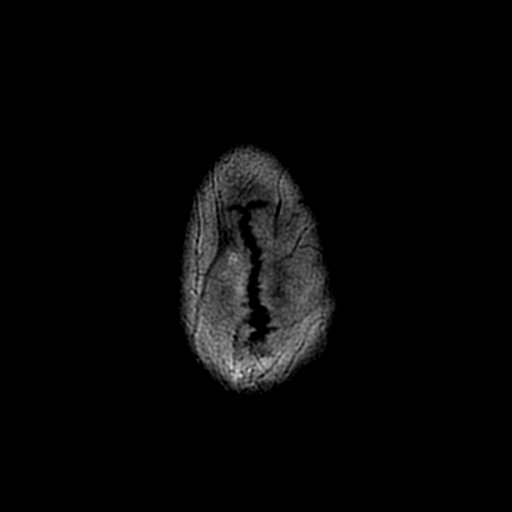

[Series 7: DWI · coronal · 5.0mm · 1.02mm/px · 6 of 72 slices shown (2 of 4)]
[im 1/72]
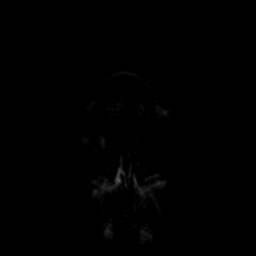
[im 15/72]
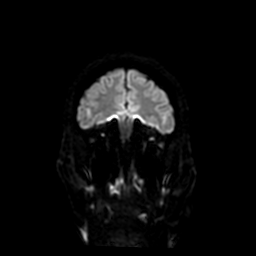
[im 29/72]
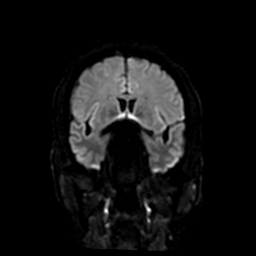
[im 43/72]
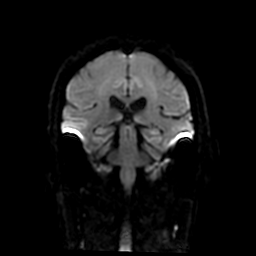
[im 57/72]
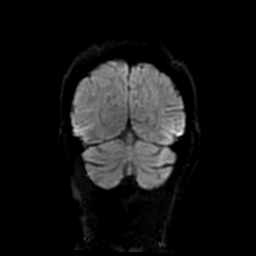
[im 72/72]
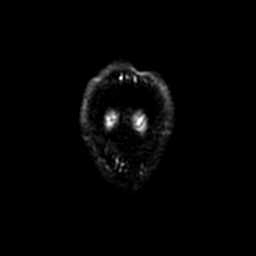

[Series 8: (person_name) · axial · 3.6mm · 0.47mm/px · z∈[-128,-44]mm · 5 of 176 slices shown]
[im 1/176]
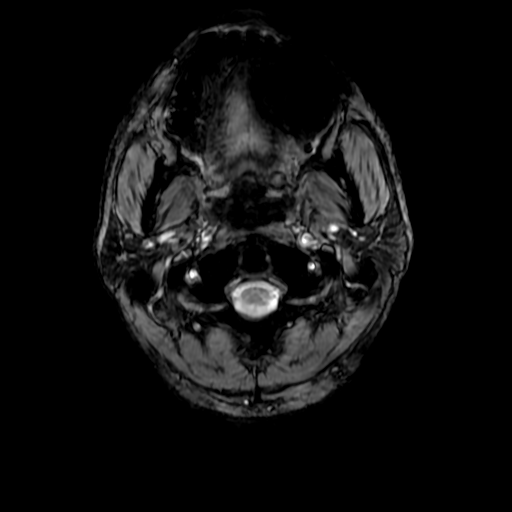
[im 26/176]
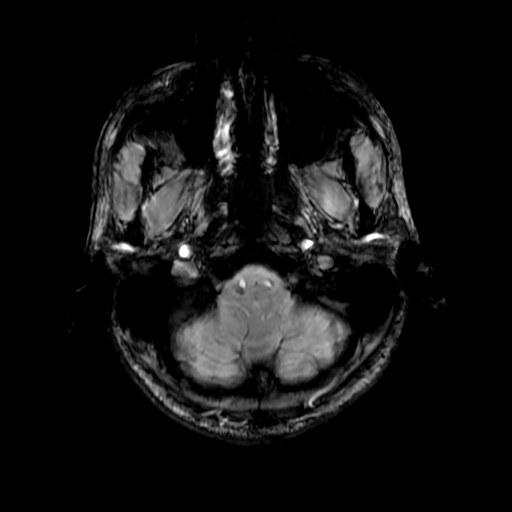
[im 51/176]
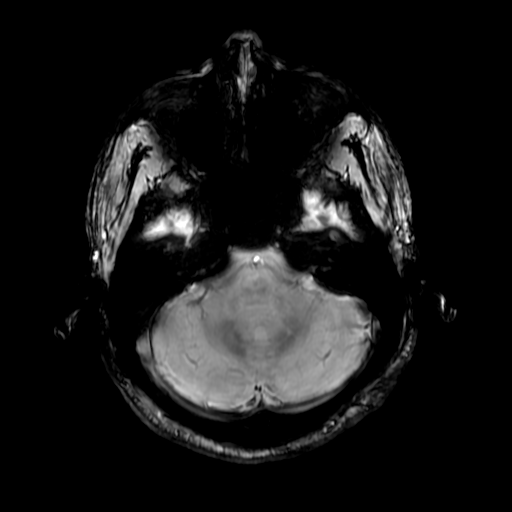
[im 76/176]
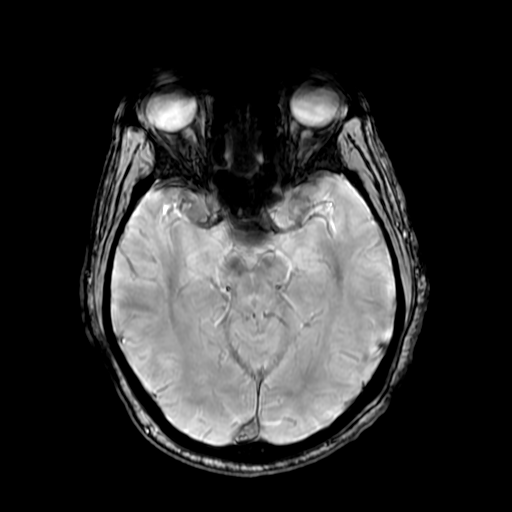
[im 101/176]
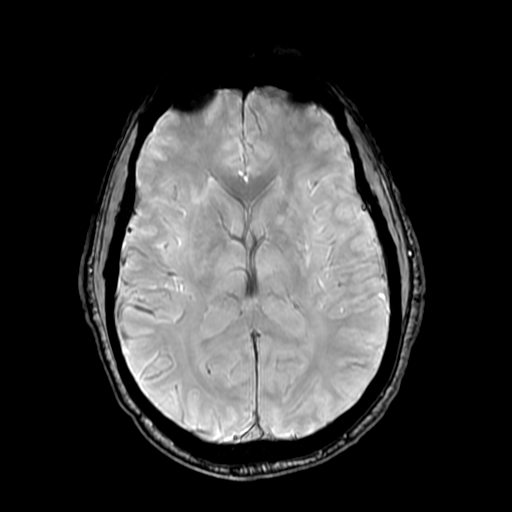

[Series 10: T2 · coronal · 5.0mm · 0.47mm/px · 3 of 30 slices shown (2 of 2)]
[im 1/30]
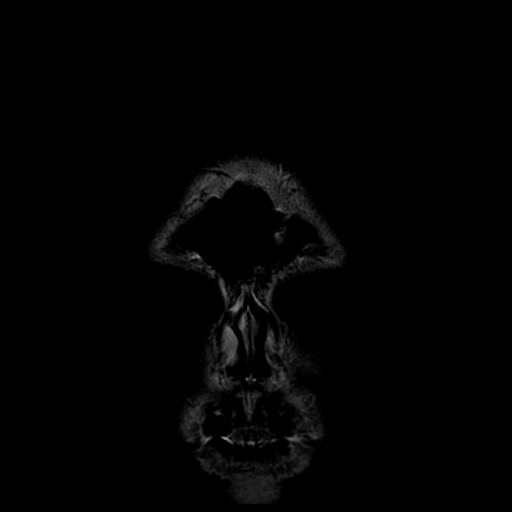
[im 15/30]
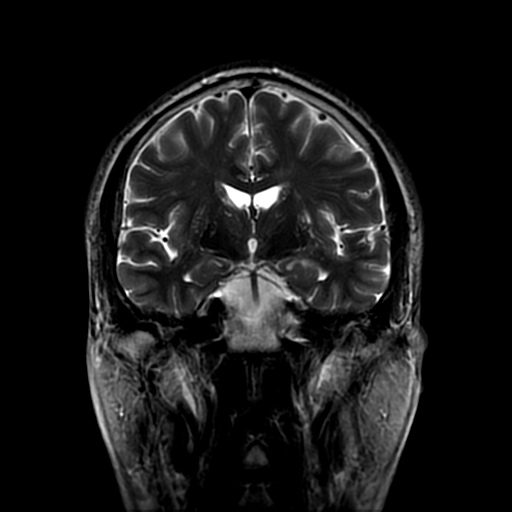
[im 30/30]
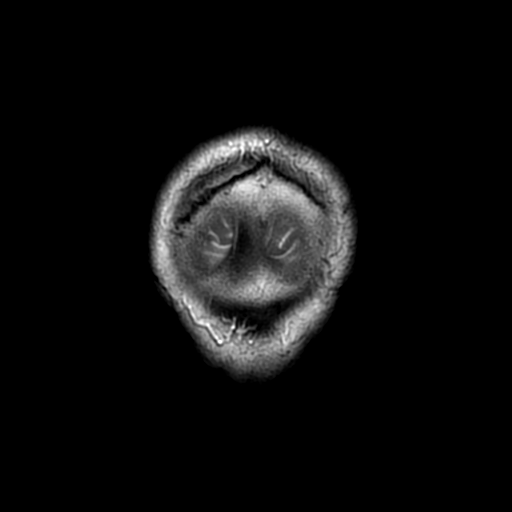

[Series 400: DWI · axial · 5.0mm · 1.02mm/px · z∈[-130,+11]mm · 3 of 31 slices shown (3 of 4)]
[im 1/31]
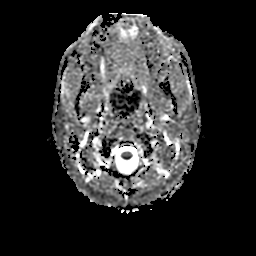
[im 16/31]
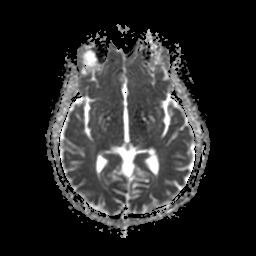
[im 31/31]
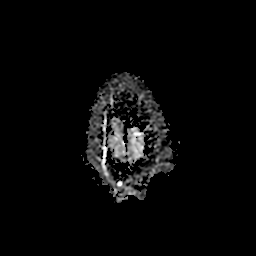

[Series 700: DWI · coronal · 5.0mm · 1.02mm/px · 3 of 36 slices shown (4 of 4)]
[im 1/36]
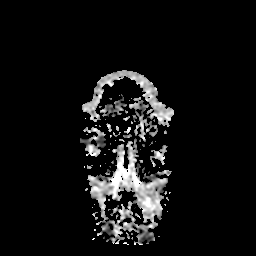
[im 18/36]
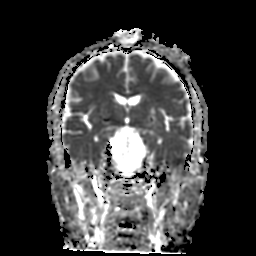
[im 36/36]
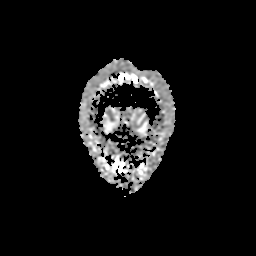

[31 of 48 positions shown; findings below may reference images not displayed]

FINDINGS: No acute infarct.

Remote infarcts involving the thalamus, basal ganglia, pons and
corpus callosum bilaterally. Remote tiny left cerebellar infarct.

Cerebellar atrophy. This may reflect result of seizures or treatment
of seizures. Excessive alcohol use can cause a similar appearance.

No intracranial mass lesion noted on this unenhanced exam.

Degenerative changes C1-2 articulation.

Major intracranial vascular structures are patent. Limited for
evaluating previously noted tiny left internal carotid artery
cavernous segment aneurysm.

Cervical medullary junction, pituitary region, pineal region and
orbital structures unremarkable.
IMPRESSION: No acute infarct.

Remote infarcts involving the thalamus, basal ganglia, pons and
corpus callosum bilaterally. Remote tiny left cerebellar infarct.

Cerebellar atrophy.

Please see above.

## 2016-06-26 ENCOUNTER — Encounter: Payer: Self-pay | Admitting: Neurology

## 2016-06-26 ENCOUNTER — Ambulatory Visit (INDEPENDENT_AMBULATORY_CARE_PROVIDER_SITE_OTHER): Payer: BLUE CROSS/BLUE SHIELD | Admitting: Neurology

## 2016-06-26 VITALS — BP 150/79 | HR 60 | Ht 72.0 in | Wt 161.0 lb

## 2016-06-26 DIAGNOSIS — R202 Paresthesia of skin: Secondary | ICD-10-CM | POA: Diagnosis not present

## 2016-06-26 DIAGNOSIS — R200 Anesthesia of skin: Secondary | ICD-10-CM

## 2016-06-26 NOTE — Progress Notes (Signed)
[facility] [address] [place]. [place] [address] [phone]       OFFICE CONSULT NOTE  Rodney Page Date of Birth:  1961/04/07 Medical Record Number:  [id number]   Referring MD:  Catha Gosselin  Reason for Referral:  Right hand numbness HPI: [name] is a 55 year year Caucasian male with history of hypertension, hyperlipidemia, anxiety and cryptogenic stroke in 2013 with patent foramen ovale. He has noticed right hand tingling and numbness for the last 2 months. He describes this as intermittent. There are no specific triggers or relieving factors. This involves all the fingers. Denies any weakness. There is no neck injury, motor vehicle accident, whiplash, neck pain or radicular pain. Is able to grip objects well without weakness. Denies any excessive activity with rapid repetitive wrist flexion. He feels a numbness and tingling is not bothersome but annoying only.. He has remote history of right posterior limb internal capsule infarct in March 2013 and he was found to patent foramen ovale. He participated in the reduce PFO closure trial and was randomized for endovascular closure. Arm. His vascular risk factors of hypertension and hyperlipidemia. He remains on aspirin which is tolerating well without bruising or bleeding. States her blood pressure is well controlled though it is slightly elevated 150/79 today.  ROS:   14 system review of systems is positive for  chills, feeling cold, tingling, numbness. and all other systems negative  PMH:  Past Medical History  Diagnosis Date  . Hypertension   . Hyperlipidemia   . 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 Stroke [facility])     possible stroke 2009 per wife  . Stroke (HCC) 02/2012    residual left sided weakness (leg); "said he's had lots of min strokes before this too" (08/24/2012)  . Brain aneurysm     right side of his brain; found 02/2012    Social History:  Social History   Social  History  . Marital Status: Married    Spouse Name: Lamar Laundry  . Number of Children: 0  . Years of Education: college   Occupational History  . Not on file.   Social History Main Topics  . Smoking status: Former Smoker -- 1.00 packs/day for 20 years    Types: Cigarettes    Quit date: 12/08/1997  . Smokeless tobacco: Never Used  . Alcohol Use: No  . Drug Use: Yes    Special: Marijuana     Comment: OCC. once per week  . Sexual Activity: Yes   Other Topics Concern  . Not on file   Social History Narrative   Patient lives at home with his wife Lamar Laundry).   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   Education college.   Right handed.   Caffeine One two liter of mountain dew daily.    Medications:   Current Outpatient Prescriptions on File Prior to Visit  Medication Sig Dispense Refill  . amLODipine (NORVASC) 10 MG tablet Take 10 mg by mouth daily.    Marland Kitchen aspirin 81 MG tablet Take 81 mg by mouth daily.    . Cyanocobalamin (VITAMIN B 12 PO) Take by mouth daily.    . Multiple Vitamin (MULITIVITAMIN WITH MINERALS) TABS Take 1 tablet by mouth daily.     No current facility-administered medications on file prior to visit.    Allergies:   Allergies  Allergen Reactions  . Iodinated Diagnostic Agents Other (See Comments)    MRI DYE; "face  and skin; broke out in hives and swelling"    Physical Exam General: well developed, well nourished, seated, in no evident distress Head: head normocephalic and atraumatic.   Neck: supple with no carotid or supraclavicular bruits Cardiovascular: regular rate and rhythm, no murmurs Musculoskeletal: no deformity Skin:  no rash/petichiae Vascular:  Normal pulses all extremities  Neurologic Exam Mental Status: Awake and fully alert. Oriented to place and time. Recent and remote memory intact. Attention span, concentration and fund of knowledge appropriate. Mood and affect appropriate.  Cranial Nerves: Fundoscopic exam reveals sharp disc  margins. Pupils equal, briskly reactive to light. Extraocular movements full without nystagmus. Visual fields full to confrontation. Hearing intact. Facial sensation intact. Face, tongue, palate moves normally and symmetrically.  Motor: Normal bulk and tone. Normal strength in all tested extremity muscles.Diminished fine finger movements on the left. Minimum left grip weakness. Orbits right over left upper extremity. Sensory.: intact to touch , pinprick , position and vibratory sensation. Tinel's sign is negative over both wrists. Coordination: Rapid alternating movements normal in all extremities. Finger-to-nose and heel-to-shin performed accurately bilaterally. Gait and Station: Arises from chair without difficulty. Stance is normal. Gait demonstrates normal stride length and balance . Able to heel, toe and tandem walk without difficulty.  Reflexes: 1+ and symmetric. Toes downgoing.   NIHSS  0 Modified Rankin  1   ASSESSMENT: 55 year old African-American male with intermittent right hand paresthesias of unclear etiology. Possibilities include carpal tunnel syndrome versus cervical radiculopathy. Remote history of right subcortical infarct with patent foramen ovale in March 2013. With endovascular PFO closure. Vascular risk factors of hypertension and hyperlipidemia.   PLAN: I had a long discussion with the patient regards his intermittent right hand tingling and numbness and discussed differential diagnoses include carpal tunnel syndrome versus cervical radiculopathy. I recommend he use wrist extension splint at all times. His paresthesias are not severe enough at the present time to consider trial of medications like gabapentin and Topamax. Check nerve conduction study and MRI of the cervical spine. Continue aspirin for stroke prevention with strict control of hypertension with blood pressure goal below 130/90. Greater than 50% time during this 30 minute consultation visit was spent on counseling  and coordination of care about and hand numbness in stroke risk prevention and treatment Return for follow-up in 3 months or call earlier if necessary. Delia HeadyPramod Sethi, MD  [facility] [address] [place], [place] [id number]  Phone [phone] Fax [phone] Note: This document was prepared with digital dictation and possible smart phrase technology. Any transcriptional errors that result from this process are unintentional.

## 2016-06-26 NOTE — Patient Instructions (Signed)
I had a long discussion with the patient regards his intermittent right hand tingling and numbness and discussed differential diagnoses include carpal tunnel syndrome versus cervical radiculopathy. I recommend he use wrist extension splint at all times. His paresthesias are not severe enough at the present time to consider trial of medications like gabapentin and Topamax. Check nerve conduction study and MRI of the cervical spine. Continue aspirin for stroke prevention with strict control of hypertension with blood pressure goal below 130/90. Return for follow-up in 3 months or call earlier if necessary.  Carpal Tunnel Syndrome Carpal tunnel syndrome is a condition that causes pain in your hand and arm. The carpal tunnel is a narrow area located on the palm side of your wrist. Repeated wrist motion or certain diseases may cause swelling within the tunnel. This swelling pinches the main nerve in the wrist (median nerve). CAUSES  This condition may be caused by:   Repeated wrist motions.  Wrist injuries.  Arthritis.  A cyst or tumor in the carpal tunnel.  Fluid buildup during pregnancy. Sometimes the cause of this condition is not known.  RISK FACTORS This condition is more likely to develop in:   People who have jobs that cause them to repeatedly move their wrists in the same motion, such as butchers and cashiers.  Women.  People with certain conditions, such as:  Diabetes.  Obesity.  An underactive thyroid (hypothyroidism).  Kidney failure. SYMPTOMS  Symptoms of this condition include:   A tingling feeling in your fingers, especially in your thumb, index, and middle fingers.  Tingling or numbness in your hand.  An aching feeling in your entire arm, especially when your wrist and elbow are bent for long periods of time.  Wrist pain that goes up your arm to your shoulder.  Pain that goes down into your palm or fingers.  A weak feeling in your hands. You may have trouble  grabbing and holding items. Your symptoms may feel worse during the night.  DIAGNOSIS  This condition is diagnosed with a medical history and physical exam. You may also have tests, including:   An electromyogram (EMG). This test measures electrical signals sent by your nerves into the muscles.  X-rays. TREATMENT  Treatment for this condition includes:  Lifestyle changes. It is important to stop doing or modify the activity that caused your condition.  Physical or occupational therapy.  Medicines for pain and inflammation. This may include medicine that is injected into your wrist.  A wrist splint.  Surgery. HOME CARE INSTRUCTIONS  If You Have a Splint:  Wear it as told by your health care provider. Remove it only as told by your health care provider.  Loosen the splint if your fingers become numb and tingle, or if they turn cold and blue.  Keep the splint clean and dry. General Instructions  Take over-the-counter and prescription medicines only as told by your health care provider.  Rest your wrist from any activity that may be causing your pain. If your condition is work related, talk to your employer about changes that can be made, such as getting a wrist pad to use while typing.  If directed, apply ice to the painful area:  Put ice in a plastic bag.  Place a towel between your skin and the bag.  Leave the ice on for 20 minutes, 2-3 times per day.  Keep all follow-up visits as told by your health care provider. This is important.  Do any exercises as told by  your health care provider, physical therapist, or occupational therapist. SEEK MEDICAL CARE IF:   You have new symptoms.  Your pain is not controlled with medicines.  Your symptoms get worse.   This information is not intended to replace advice given to you by your health care provider. Make sure you discuss any questions you have with your health care provider.   Document Released: 11/21/2000 Document  Revised: 08/15/2015 Document Reviewed: 04/11/2015 Elsevier Interactive Patient Education [employer]

## 2016-07-11 ENCOUNTER — Telehealth: Payer: Self-pay | Admitting: Neurology

## 2016-07-11 NOTE — Telephone Encounter (Signed)
Spoke with patient who stated that he wasn't sure why he needed the Cervical Spine MRI. He would like to speak with someone regarding this before he schedules. Please call and advise.

## 2016-07-11 NOTE — Telephone Encounter (Signed)
LVM for pt to call back. Gave [facility] phone number.   Per Dr Marlis Edelson OV note on 06/26/16, we are doing MRI cervical to evaluate his right hand tingling and numbness. We are checking to see if there is anything in his cervical spine that could be causing his symptoms.

## 2016-07-14 NOTE — Telephone Encounter (Signed)
Tried calling pt back since no return call. Someone picked up and then hung up phone.

## 2016-07-16 NOTE — Telephone Encounter (Signed)
Rn call patient back about MRI of cervical spine. Rn stated the test is for the right hand tingling, numbness to rule out. PT ask how much will it be. Rn stated [facility]. Pt verbalized understanding.

## 2016-07-16 NOTE — Telephone Encounter (Signed)
Rodney Page- I tried calling this pt twice. Unable to reach. This is Dr Pearlean BrownieSethi patient

## 2016-07-29 NOTE — Telephone Encounter (Signed)
Katrina, I have called patient twice and have been unable to reach him.

## 2016-08-01 ENCOUNTER — Encounter: Payer: BLUE CROSS/BLUE SHIELD | Admitting: Neurology

## 2016-09-26 ENCOUNTER — Telehealth: Payer: Self-pay | Admitting: Neurology

## 2016-09-26 NOTE — Telephone Encounter (Signed)
Spoke with patient who was wanting to know if his EMG/NCS was covered by insurance. I [facility] to [facility]

## 2016-09-29 ENCOUNTER — Ambulatory Visit: Payer: BLUE CROSS/BLUE SHIELD | Admitting: Neurology

## 2022-05-08 ENCOUNTER — Ambulatory Visit (INDEPENDENT_AMBULATORY_CARE_PROVIDER_SITE_OTHER): Payer: 59 | Admitting: Primary Care

## 2022-05-08 ENCOUNTER — Encounter (INDEPENDENT_AMBULATORY_CARE_PROVIDER_SITE_OTHER): Payer: Self-pay | Admitting: Primary Care

## 2022-05-08 VITALS — BP 154/71 | HR 65 | Temp 98.1°F | Ht 71.0 in | Wt 161.6 lb

## 2022-05-08 DIAGNOSIS — Z131 Encounter for screening for diabetes mellitus: Secondary | ICD-10-CM

## 2022-05-08 DIAGNOSIS — I720 Aneurysm of carotid artery: Secondary | ICD-10-CM | POA: Diagnosis not present

## 2022-05-08 DIAGNOSIS — Z7689 Persons encountering health services in other specified circumstances: Secondary | ICD-10-CM

## 2022-05-08 LAB — POCT GLYCOSYLATED HEMOGLOBIN (HGB A1C): Hemoglobin A1C: 5.3 % (ref 4.0–5.6)

## 2022-05-08 NOTE — Patient Instructions (Signed)
Hypertension, Adult High blood pressure (hypertension) is when the force of blood pumping through the arteries is too strong. The arteries are the blood vessels that carry blood from the heart throughout the body. Hypertension forces the heart to work harder to pump blood and may cause arteries to become narrow or stiff. Untreated or uncontrolled hypertension can lead to a heart attack, heart failure, a stroke, kidney disease, and other problems. A blood pressure reading consists of a higher number over a lower number. Ideally, your blood pressure should be below 120/80. The first ("top") number is called the systolic pressure. It is a measure of the pressure in your arteries as your heart beats. The second ("bottom") number is called the diastolic pressure. It is a measure of the pressure in your arteries as the heart relaxes. What are the causes? The exact cause of this condition is not known. There are some conditions that result in high blood pressure. What increases the risk? Certain factors may make you more likely to develop high blood pressure. Some of these risk factors are under your control, including: Smoking. Not getting enough exercise or physical activity. Being overweight. Having too much fat, sugar, calories, or salt (sodium) in your diet. Drinking too much alcohol. Other risk factors include: Having a personal history of heart disease, diabetes, high cholesterol, or kidney disease. Stress. Having a family history of high blood pressure and high cholesterol. Having obstructive sleep apnea. Age. The risk increases with age. What are the signs or symptoms? High blood pressure may not cause symptoms. Very high blood pressure (hypertensive crisis) may cause: Headache. Fast or irregular heartbeats (palpitations). Shortness of breath. Nosebleed. Nausea and vomiting. Vision changes. Severe chest pain, dizziness, and seizures. How is this diagnosed? This condition is diagnosed by  measuring your blood pressure while you are seated, with your arm resting on a flat surface, your legs uncrossed, and your feet flat on the floor. The cuff of the blood pressure monitor will be placed directly against the skin of your upper arm at the level of your heart. Blood pressure should be measured at least twice using the same arm. Certain conditions can cause a difference in blood pressure between your right and left arms. If you have a high blood pressure reading during one visit or you have normal blood pressure with other risk factors, you may be asked to: Return on a different day to have your blood pressure checked again. Monitor your blood pressure at home for 1 week or longer. If you are diagnosed with hypertension, you may have other blood or imaging tests to help your health care provider understand your overall risk for other conditions. How is this treated? This condition is treated by making healthy lifestyle changes, such as eating healthy foods, exercising more, and reducing your alcohol intake. You may be referred for counseling on a healthy diet and physical activity. Your health care provider may prescribe medicine if lifestyle changes are not enough to get your blood pressure under control and if: Your systolic blood pressure is above 130. Your diastolic blood pressure is above 80. Your personal target blood pressure may vary depending on your medical conditions, your age, and other factors. Follow these instructions at home: Eating and drinking  Eat a diet that is high in fiber and potassium, and low in sodium, added sugar, and fat. An example of this eating plan is called the DASH diet. DASH stands for Dietary Approaches to Stop Hypertension. To eat this way: Eat   plenty of fresh fruits and vegetables. Try to fill one half of your plate at each meal with fruits and vegetables. Eat whole grains, such as whole-wheat pasta, brown rice, or whole-grain bread. Fill about one  fourth of your plate with whole grains. Eat or drink low-fat dairy products, such as skim milk or low-fat yogurt. Avoid fatty cuts of meat, processed or cured meats, and poultry with skin. Fill about one fourth of your plate with lean proteins, such as fish, chicken without skin, beans, eggs, or tofu. Avoid pre-made and processed foods. These tend to be higher in sodium, added sugar, and fat. Reduce your daily sodium intake. Many people with hypertension should eat less than 1,500 mg of sodium a day. Do not drink alcohol if: Your health care provider tells you not to drink. You are pregnant, may be pregnant, or are planning to become pregnant. If you drink alcohol: Limit how much you have to: 0-1 drink a day for women. 0-2 drinks a day for men. Know how much alcohol is in your drink. In the [place] one drink equals one 12 oz bottle of beer (355 mL), one 5 oz glass of wine (148 mL), or one 1 oz glass of hard liquor (44 mL). Lifestyle  Work with your health care provider to maintain a healthy body weight or to lose weight. Ask what an ideal weight is for you. Get at least 30 minutes of exercise that causes your heart to beat faster (aerobic exercise) most days of the week. Activities may include walking, swimming, or biking. Include exercise to strengthen your muscles (resistance exercise), such as Pilates or lifting weights, as part of your weekly exercise routine. Try to do these types of exercises for 30 minutes at least 3 days a week. Do not use any products that contain nicotine or tobacco. These products include cigarettes, chewing tobacco, and vaping devices, such as e-cigarettes. If you need help quitting, ask your health care provider. Monitor your blood pressure at home as told by your health care provider. Keep all follow-up visits. This is important. Medicines Take over-the-counter and prescription medicines only as told by your health care provider. Follow directions carefully. Blood  pressure medicines must be taken as prescribed. Do not skip doses of blood pressure medicine. Doing this puts you at risk for problems and can make the medicine less effective. Ask your health care provider about side effects or reactions to medicines that you should watch for. Contact a health care provider if you: Think you are having a reaction to a medicine you are taking. Have headaches that keep coming back (recurring). Feel dizzy. Have swelling in your ankles. Have trouble with your vision. Get help right away if you: Develop a severe headache or confusion. Have unusual weakness or numbness. Feel faint. Have severe pain in your chest or abdomen. Vomit repeatedly. Have trouble breathing. These symptoms may be an emergency. Get help right away. Call 911. Do not wait to see if the symptoms will go away. Do not drive yourself to the hospital. Summary Hypertension is when the force of blood pumping through your arteries is too strong. If this condition is not controlled, it may put you at risk for serious complications. Your personal target blood pressure may vary depending on your medical conditions, your age, and other factors. For most people, a normal blood pressure is less than 120/80. Hypertension is treated with lifestyle changes, medicines, or a combination of both. Lifestyle changes include losing weight, eating a healthy,   low-sodium diet, exercising more, and limiting alcohol. This information is not intended to replace advice given to you by your health care provider. Make sure you discuss any questions you have with your health care provider. Document Revised: 10/01/2021 Document Reviewed: 10/01/2021 Elsevier Patient Education  2023 Else[employer] Inc.  

## 2022-05-08 NOTE — Progress Notes (Unsigned)
New Patient Office Visit  Subjective    Patient ID: Rodney Page, male    DOB: 28-May-1961  Age: 61 y.o. MRN: [id number]  CC:  Chief Complaint  Patient presents with   New Patient (Initial Visit)    HPI Rodney Page is a 61 year old male thin frame male (Body mass index is 22.54 kg/m. )presents to establish care. She has a history of hypertension.Patient has No headache, No chest pain, No abdominal pain - No Nausea, No new weakness tingling or numbness, No Cough - shortness of breath    Outpatient Encounter Medications as of 05/08/2022  Medication Sig   amLODipine (NORVASC) 10 MG tablet Take 10 mg by mouth daily.   aspirin 81 MG tablet Take 81 mg by mouth daily.   Cyanocobalamin (VITAMIN B 12 PO) Take by mouth daily.   Multiple Vitamin (MULITIVITAMIN WITH MINERALS) TABS Take 1 tablet by mouth daily.   valsartan (DIOVAN) 80 MG tablet Take 80 mg by mouth daily.   No facility-administered encounter medications on file as of 05/08/2022.    Past Medical History:  Diagnosis Date   Brain aneurysm    right side of his brain; found 02/2012   Hyperlipidemia    Hypertension    Seizure ([place]) 1998   "probably alcohol related"   Stroke [facility])    possible stroke 2009 per wife   Stroke ([place]) 02/2012   residual left sided weakness (leg); "said he's had lots of min strokes before this too" (08/24/2012)    Past Surgical History:  Procedure Laterality Date   PATENT FORAMEN OVALE CLOSURE  08/24/2012   PATENT FORAMEN OVALE CLOSURE Bilateral 08/24/2012   Procedure: PATENT FORAMEN OVALE CLOSURE;  Surgeon: Laverda Page, MD;  Location: [facility] CATH LAB;  Service: Cardiovascular;  Laterality: Bilateral;   PATENT FORAMEN OVALE CLOSURE N/A 09/13/2013   Procedure: Cyndy Freeze;  Surgeon: Laverda Page, MD;  Location: [facility] CATH LAB;  Service: Cardiovascular;  Laterality: N/A;   TEE WITHOUT CARDIOVERSION  02/13/2012   Procedure: TRANSESOPHAGEAL ECHOCARDIOGRAM (TEE);  Surgeon: Thayer Headings, MD;   Location: [place];  Service: Cardiovascular;  Laterality: N/A;   TEE WITHOUT CARDIOVERSION N/A 09/05/2014   Procedure: TRANSESOPHAGEAL ECHOCARDIOGRAM (TEE);  Surgeon: Laverda Page, MD;  Location: [place];  Service: Cardiovascular;  Laterality: N/A;    Family History  Problem Relation Age of Onset   Heart disease Mother    Hypertension Sister    Hypertension Brother     Social History   Socioeconomic History   Marital status: Married    Spouse name: Sonya   Number of children: 0   Years of education: college   Highest education level: Not on file  Occupational History   Not on file  Tobacco Use   Smoking status: Former    Packs/day: 1.00    Years: 20.00    Pack years: 20.00    Types: Cigarettes    Quit date: 12/08/1997    Years since quitting: 24.4   Smokeless tobacco: Never  Substance and Sexual Activity   Alcohol use: No    Alcohol/week: 0.0 standard drinks   Drug use: Yes    Types: Marijuana    Comment: OCC. once per week   Sexual activity: Yes  Other Topics Concern   Not on file  Social History Narrative   Patient lives at home with his wife Davy Pique).   Patient works full time [employer].   Education college.   Right handed.  Caffeine One two liter of mountain dew daily.   Social Determinants of Health   Financial Resource Strain: Not on file  Food Insecurity: Not on file  Transportation Needs: Not on file  Physical Activity: Not on file  Stress: Not on file  Social Connections: Not on file  Intimate Partner Violence: Not on file    ROS Comprehensive ROS Pertinent positive and negative noted in HPI     Objective    BP (!) 154/71   Pulse 65   Temp 98.1 F (36.7 C) (Oral)   Ht 5\' 11"  (1.803 m)   Wt 161 lb 9.6 oz (73.3 kg)   SpO2 100%   BMI 22.54 kg/m   Physical Exam HENT:     Head: Normocephalic.     Right Ear: Tympanic membrane and external ear normal.     Left Ear: Tympanic membrane and external ear normal.      Nose: Nose normal.  Eyes:     Extraocular Movements: Extraocular movements intact.     Pupils: Pupils are equal, round, and reactive to light.  Cardiovascular:     Rate and Rhythm: Normal rate and regular rhythm.  Pulmonary:     Effort: Pulmonary effort is normal.  Abdominal:     General: Abdomen is flat. Bowel sounds are normal.     Palpations: Abdomen is soft.  Skin:    General: Skin is warm and dry.  Neurological:     Mental Status: He is alert and oriented to person, place, and time.  Psychiatric:        Mood and Affect: Mood normal.        Thought Content: Thought content normal.     Assessment & Plan:  Rodney Page was seen today for new patient (initial visit).  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  Encounter to establish care Establish care with PCP      No follow-ups on file.   Kerin Perna, NP

## 2022-06-12 NOTE — Progress Notes (Signed)
Cardiology Office Note:    Date:  06/13/2022   ID:  Rodney Page, DOB 1961/01/19, MRN [id number]  PCP:  Catha Gosselin, MD  Cardiologist:  None   Referring MD: Catha Gosselin, MD   Chief Complaint  Patient presents with   Advice Only    Carotid aneurysm    History of Present Illness:    Rodney Page is a 61 y.o. male with a hx of hyperlipidemia, primary hypertension, CVA with left sided weakness , h/o surgical PFO closure, anxiety related to work stress, who is referred for ?carotid artery aneurysm (unable to find diagnostic study).  Referred for concern about a aneurysm.  His data suggests that he has history of a brain aneurysm diagnosed in 2013.  The patient is asymptomatic.  No recent imaging.  No complaints.  Past Medical History:  Diagnosis Date   Brain aneurysm    right side of his brain; found 02/2012   Hyperlipidemia    Hypertension    Seizure ([facility]) 1998   "probably alcohol related"   Stroke [facility])    possible stroke 2009 per wife   Stroke ([facility]) 02/2012   residual left sided weakness (leg); "said he's had lots of min strokes before this too" (08/24/2012)    Past Surgical History:  Procedure Laterality Date   PATENT FORAMEN OVALE CLOSURE  08/24/2012   PATENT FORAMEN OVALE CLOSURE Bilateral 08/24/2012   Procedure: PATENT FORAMEN OVALE CLOSURE;  Surgeon: Pamella Pert, MD;  Location[facility] CATH LAB;  Service: Cardiovascular;  Laterality: Bilateral;   PATENT FORAMEN OVALE CLOSURE N/A 09/13/2013   Procedure: Horace Porteous;  Surgeon: Pamella Pert, MD;  Location: [facility] CATH LAB;  Service: Cardiovascular;  Laterality: N/A;   TEE WITHOUT CARDIOVERSION  02/13/2012   Procedure: TRANSESOPHAGEAL ECHOCARDIOGRAM (TEE);  Surgeon: Vesta Mixer, MD;  Location: [facility] ENDOSCOPY;  Service: Cardiovascular;  Laterality: N/A;   TEE WITHOUT CARDIOVERSION N/A 09/05/2014   Procedure: TRANSESOPHAGEAL ECHOCARDIOGRAM (TEE);  Surgeon: Pamella Pert, MD;  Location: [facility] ENDOSCOPY;  Service:  Cardiovascular;  Laterality: N/A;    Current Medications: Current Meds  Medication Sig   amLODipine (NORVASC) 10 MG tablet Take 10 mg by mouth daily.   aspirin 81 MG tablet Take 81 mg by mouth daily.   Cholecalciferol (VITAMIN D3 PO) Take 1 tablet by mouth daily at 12 noon.   Cobalamin Combinations (B-12) [phone] MCG SUBL Place under the tongue.   Cyanocobalamin (VITAMIN B 12 PO) Take by mouth daily.   Multiple Vitamin (MULITIVITAMIN WITH MINERALS) TABS Take 1 tablet by mouth daily.   valsartan (DIOVAN) 80 MG tablet Take 80 mg by mouth daily.     Allergies:   Iodinated contrast media   Social History   Socioeconomic History   Marital status: Married    Spouse name: Rodney Page   Number of children: 0   Years of education: college   Highest education level: Not on file  Occupational History   Not on file  Tobacco Use   Smoking status: Former    Packs/day: 1.00    Years: 20.00    Total pack years: 20.00    Types: Cigarettes    Quit date: 12/08/1997    Years since quitting: 24.5   Smokeless tobacco: Never  Substance and Sexual Activity   Alcohol use: No    Alcohol/week: 0.0 standard drinks of alcohol   Drug use: Yes    Types: Marijuana    Comment: OCC. once per week   Sexual activity: Yes  Other Topics Concern   Not on file  Social History Narrative   Patient lives at home with his wife Rodney Page).   Patient works full time [employer] Page.   Education college.   Right handed.   Caffeine One two liter of mountain dew daily.   Social Determinants of Health   Financial Resource Strain: Not on file  Food Insecurity: Not on file  Transportation Needs: Not on file  Physical Activity: Not on file  Stress: Not on file  Social Connections: Not on file     Family History: The patient's family history includes Heart disease in his mother; Hypertension in his brother and sister.  ROS:   Please see the history of present illness.    No complaints all other systems  reviewed and are negative.  EKGs/Labs/Other Studies Reviewed:    The following studies were reviewed today: No new data  EKG:  EKG sinus rhythm with prominent voltage.  No significant abnormality  Recent Labs: No results found for requested labs within last 365 days.  Recent Lipid Panel    Component Value Date/Time   CHOL 132 02/12/2012 0535   TRIG 98 02/12/2012 0535   HDL 59 02/12/2012 0535   CHOLHDL 2.2 02/12/2012 0535   VLDL 20 02/12/2012 0535   LDLCALC 53 02/12/2012 0535    Physical Exam:    VS:  BP 132/72   Pulse 60   Ht 6' (1.829 m)   Wt 159 lb 3.2 oz (72.2 kg)   SpO2 96%   BMI 21.59 kg/m     Wt Readings from Last 3 Encounters:  06/13/22 159 lb 3.2 oz (72.2 kg)  05/08/22 161 lb 9.6 oz (73.3 kg)  06/26/16 161 lb (73 kg)     GEN: Slender, healthy looking. No acute distress HEENT: Normal NECK: No JVD. LYMPHATICS: No lymphadenopathy CARDIAC: No murmur. RRR no gallop, or edema. VASCULAR:  Normal Pulses. No bruits. RESPIRATORY:  Clear to auscultation without rales, wheezing or rhonchi  ABDOMEN: Soft, non-tender, non-distended, No pulsatile mass, MUSCULOSKELETAL: No deformity  SKIN: Warm and dry NEUROLOGIC:  Alert and oriented x 3 PSYCHIATRIC:  Normal affect   ASSESSMENT:    1. Cerebral aneurysm, nonruptured   2. Primary hypertension    PLAN:    In order of problems listed above:  Find out why the patient was referred to cardiology.  If they are concerned about a cerebral aneurysm.  This needs to be followed by neurosurgery or interventional radiology or vascular surgery.  No cardiac work-up is ordered.   Medication Adjustments/Labs and Tests Ordered: Current medicines are reviewed at length with the patient today.  Concerns regarding medicines are outlined above.  No orders of the defined types were placed in this encounter.  No orders of the defined types were placed in this encounter.   There are no Patient Instructions on file for this visit.    Signed, Lesleigh Noe, MD  06/13/2022 4:38 PM    [facility]

## 2022-06-13 ENCOUNTER — Ambulatory Visit: Payer: 59 | Admitting: Interventional Cardiology

## 2022-06-13 ENCOUNTER — Encounter: Payer: Self-pay | Admitting: Interventional Cardiology

## 2022-06-13 VITALS — BP 132/72 | HR 60 | Ht 72.0 in | Wt 159.2 lb

## 2022-06-13 DIAGNOSIS — I10 Essential (primary) hypertension: Secondary | ICD-10-CM | POA: Diagnosis not present

## 2022-06-13 DIAGNOSIS — I671 Cerebral aneurysm, nonruptured: Secondary | ICD-10-CM | POA: Diagnosis not present

## 2022-06-13 NOTE — Patient Instructions (Signed)
Medication Instructions:  Your physician recommends that you continue on your current medications as directed. Please refer to the Current Medication list given to you today.  *If you need a refill on your cardiac medications before your next appointment, please call your pharmacy*  Lab Work: NONE  Testing/Procedures: NONE   Important Information About Sugar       

## 2022-07-11 ENCOUNTER — Other Ambulatory Visit (INDEPENDENT_AMBULATORY_CARE_PROVIDER_SITE_OTHER): Payer: Self-pay | Admitting: Primary Care

## 2022-07-11 NOTE — Telephone Encounter (Signed)
Medication Refill - Medication: valsartan (DIOVAN) 80 MG tablet amLODipine (NORVASC) 10 MG tablet   Has the patient contacted their pharmacy? Yes.   (Agent: If no, request that the patient contact the pharmacy for the refill. If patient does not wish to contact the pharmacy document the reason why and proceed with request.) (Agent: If yes, when and what did the pharmacy advise?)  Preferred Pharmacy (with phone number or street name):  [id number] - [place] ([place]), [place] - [address]  [address] [place] [address]  Phone: [phone] Fax: [phone]   Has the patient been seen for an appointment in the last year OR does the patient have an upcoming appointment? Yes.    Agent: Please be advised that RX refills may take up to 3 business days. We ask that you follow-up with your pharmacy.

## 2022-07-11 NOTE — Telephone Encounter (Signed)
Requested medications are due for refill today.  Unsure  Requested medications are on the active medications list.  yes  Last refill. Amlodipine 10mg  09/12/2013 - historical medication, Valsartan 05/08/2022 Historical medication  Future visit scheduled.   yes  Notes to clinic.  PCP listed is 07/08/2022 [facility]. Both medications are historical.    Requested Prescriptions  Pending Prescriptions Disp Refills   amLODipine (NORVASC) 10 MG tablet      Sig: Take 1 tablet (10 mg total) by mouth daily.     Cardiovascular: Calcium Channel Blockers 2 Passed - 07/11/2022  4:47 PM      Passed - Last BP in normal range    BP Readings from Last 1 Encounters:  06/13/22 132/72         Passed - Last Heart Rate in normal range    Pulse Readings from Last 1 Encounters:  06/13/22 60         Passed - Valid encounter within last 6 months    Recent Outpatient Visits           2 months ago Carotid artery aneurysm (HCC)   [facility], NP       Future Appointments             In 1 month Edwards, Grayce Sessions, NP [facility]             valsartan (DIOVAN) 80 MG tablet      Sig: Take 1 tablet (80 mg total) by mouth daily.     Cardiovascular:  Angiotensin Receptor Blockers Failed - 07/11/2022  4:47 PM      Failed - Cr in normal range and within 180 days    Creatinine, Ser  Date Value Ref Range Status  02/11/2012 0.80 0.50 - 1.35 mg/dL Final   Creatinine,U  Date Value Ref Range Status  02/12/2012 185.7 mg/dL Final    Comment:    (NOTE) Cutoff Values for Urine Drug Screen:        Drug Class           Cutoff (ng/mL)        Amphetamines            1000        Barbiturates             200        Cocaine Metabolites      300        Benzodiazepines          200        Methadone                300        Opiates                 2000        Phencyclidine             25        Propoxyphene             300        Marijuana Metabolites      50 For medical purposes only.         Failed - K in normal range and within 180 days    Potassium  Date Value Ref Range Status  02/11/2012 3.7 3.5 - 5.1 mEq/L Final         Passed - Patient is not pregnant  Passed - Last BP in normal range    BP Readings from Last 1 Encounters:  06/13/22 132/72         Passed - Valid encounter within last 6 months    Recent Outpatient Visits           2 months ago Carotid artery aneurysm (HCC)   [facility] Grayce Sessions, NP       Future Appointments             In 1 month Randa Evens, Kinnie Scales, NP [facility]

## 2022-07-14 NOTE — Telephone Encounter (Signed)
Pt is calling to check the status of medication refill. States that PCP is now [place]. He no longer is a patient of Dr. Clarene Duke.

## 2022-07-15 NOTE — Telephone Encounter (Signed)
Routed request to cardiology to refill if appropriate per PCP

## 2022-07-22 ENCOUNTER — Telehealth: Payer: Self-pay | Admitting: Interventional Cardiology

## 2022-07-22 NOTE — Telephone Encounter (Signed)
*  STAT* If patient is at the pharmacy, call can be transferred to refill team.   1. Which medications need to be refilled? (please list name of each medication and dose if known)   amLODipine (NORVASC) 10 MG tablet    2. Which pharmacy/location (including street and city if local pharmacy) is medication to be sent to? [facility] - [place] ([place]), [place] - [address]  3. Do they need a 30 day or 90 day supply? 30

## 2022-07-22 NOTE — Telephone Encounter (Signed)
Pt is requesting a refill on amlodipine. This medication has not been refilled since 2014. Would Dr. Katrinka Blazing like to refill this medication? Please address

## 2022-08-04 ENCOUNTER — Other Ambulatory Visit: Payer: Self-pay | Admitting: Interventional Cardiology

## 2022-08-04 DIAGNOSIS — I10 Essential (primary) hypertension: Secondary | ICD-10-CM

## 2022-08-04 NOTE — Progress Notes (Signed)
BMET ordered per Dr. Katrinka Blazing, needs lab appt ASAP (08/05/22-08/08/22).

## 2022-08-04 NOTE — Telephone Encounter (Signed)
Left message for patient to call our office to schedule appointment for BMET to refill Valsartan.  Provided office number for callback.

## 2022-08-13 ENCOUNTER — Ambulatory Visit: Payer: 59 | Attending: Interventional Cardiology

## 2022-08-13 DIAGNOSIS — I10 Essential (primary) hypertension: Secondary | ICD-10-CM

## 2022-08-13 LAB — BASIC METABOLIC PANEL
BUN/Creatinine Ratio: 9 — ABNORMAL LOW (ref 10–24)
BUN: 7 mg/dL — ABNORMAL LOW (ref 8–27)
CO2: 23 mmol/L (ref 20–29)
Calcium: 9.3 mg/dL (ref 8.6–10.2)
Chloride: 103 mmol/L (ref 96–106)
Creatinine, Ser: 0.79 mg/dL (ref 0.76–1.27)
Glucose: 71 mg/dL (ref 70–99)
Potassium: 4.7 mmol/L (ref 3.5–5.2)
Sodium: 138 mmol/L (ref 134–144)
eGFR: 100 mL/min/{1.73_m2} (ref >59)

## 2022-08-14 ENCOUNTER — Other Ambulatory Visit: Payer: 59

## 2022-08-14 ENCOUNTER — Encounter (INDEPENDENT_AMBULATORY_CARE_PROVIDER_SITE_OTHER): Payer: Self-pay | Admitting: Primary Care

## 2022-08-14 ENCOUNTER — Ambulatory Visit (INDEPENDENT_AMBULATORY_CARE_PROVIDER_SITE_OTHER): Payer: 59 | Admitting: Primary Care

## 2022-08-14 VITALS — BP 155/76 | HR 65 | Temp 98.3°F | Ht 72.0 in | Wt 159.2 lb

## 2022-08-14 DIAGNOSIS — Z760 Encounter for issue of repeat prescription: Secondary | ICD-10-CM | POA: Diagnosis not present

## 2022-08-14 DIAGNOSIS — Z23 Encounter for immunization: Secondary | ICD-10-CM | POA: Diagnosis not present

## 2022-08-14 DIAGNOSIS — I10 Essential (primary) hypertension: Secondary | ICD-10-CM

## 2022-08-14 MED ORDER — VALSARTAN 80 MG PO TABS: 80.0000 mg | ORAL_TABLET | Freq: Every day | ORAL | 1 refills | Status: DC

## 2022-08-14 MED ORDER — AMLODIPINE BESYLATE 10 MG PO TABS
10.0000 mg | ORAL_TABLET | Freq: Every day | ORAL | 1 refills | Status: DC
Start: 2022-08-14 — End: 2023-01-28

## 2022-08-14 NOTE — Progress Notes (Signed)
[facility]   [name] is a 61 y.o. male presents for hypertension evaluation, Denies shortness of breath, headaches, chest pain or lower extremity edema, sudden onset, vision changes, unilateral weakness, dizziness, paresthesias   Patient reports adherence with medications.  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"   Stroke [facility])    possible stroke 2009 per wife   Stroke (HCC) 02/2012   residual left sided weakness (leg); "said he's had lots of min strokes before this too" (08/24/2012)   Past Surgical History:  Procedure Laterality Date   PATENT FORAMEN OVALE CLOSURE  08/24/2012   PATENT FORAMEN OVALE CLOSURE Bilateral 08/24/2012   Procedure: PATENT FORAMEN OVALE CLOSURE;  Surgeon: Pamella Pert, MD;  Location: [facility] CATH LAB;  Service: Cardiovascular;  Laterality: Bilateral;   PATENT FORAMEN OVALE CLOSURE N/A 09/13/2013   Procedure: Horace Porteous;  Surgeon: Pamella Pert, MD;  Location: [facility] CATH LAB;  Service: Cardiovascular;  Laterality: N/A;   TEE WITHOUT CARDIOVERSION  02/13/2012   Procedure: TRANSESOPHAGEAL ECHOCARDIOGRAM (TEE);  Surgeon: Vesta Mixer, MD;  Location: [facility] ENDOSCOPY;  Service: Cardiovascular;  Laterality: N/A;   TEE WITHOUT CARDIOVERSION N/A 09/05/2014   Procedure: TRANSESOPHAGEAL ECHOCARDIOGRAM (TEE);  Surgeon: Pamella Pert, MD;  Location: [facility] ENDOSCOPY;  Service: Cardiovascular;  Laterality: N/A;   Allergies  Allergen Reactions   Iodinated Contrast Media Other (See Comments)    MRI DYE; "face and skin; broke out in hives and swelling"   Current Outpatient Medications on File Prior to Visit  Medication Sig Dispense Refill   aspirin 81 MG tablet Take 81 mg by mouth  daily.     Cholecalciferol (VITAMIN D3 PO) Take 1 tablet by mouth daily at 12 noon.     Cobalamin Combinations (B-12) [phone] MCG SUBL Place under the tongue.     Cyanocobalamin (VITAMIN B 12 PO) Take by mouth daily.     Multiple Vitamin (MULITIVITAMIN WITH MINERALS) TABS Take 1 tablet by mouth daily.     valsartan (DIOVAN) 80 MG tablet Take 80 mg by mouth daily.     amLODipine (NORVASC) 10 MG tablet Take 10 mg by mouth daily. (Patient not taking: Reported on 08/14/2022)     No current facility-administered medications on file prior to visit.   Social History   Socioeconomic History   Marital status: Married    Spouse name: Sonya   Number of children: 0   Years of education: college   Highest education level: Not on file  Occupational History   Not on file  Tobacco Use   Smoking status: Former    Packs/day: 1.00    Years: 20.00    Total pack years: 20.00    Types: Cigarettes    Quit date: 12/08/1997    Years since quitting: 24.6   Smokeless tobacco: Never  Substance and Sexual Activity   Alcohol use: No    Alcohol/week: 0.0 standard drinks of alcohol   Drug use: Yes    Types: Marijuana    Comment: OCC. once per week   Sexual activity: Yes  Other Topics Concern   Not on file  Social History Narrative   Patient lives at home with his wife Rodney Page).  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   Education college.   Right handed.   Caffeine One two liter of mountain dew daily.   Social Determinants of Health   Financial Resource Strain: Not on file  Food Insecurity: Not on file  Transportation Needs: Not on file  Physical Activity: Not on file  Stress: Not on file  Social Connections: Not on file  Intimate Partner Violence: Not on file   Family History  Problem Relation Age of Onset   Heart disease Mother    Hypertension Sister    Hypertension Brother      OBJECTIVE:  Vitals:   08/14/22 0844  BP: (!) 173/72  Pulse: 61  Temp: 98.3 F (36.8 C)   TempSrc: Oral  SpO2: 100%  Weight: 159 lb 3.2 oz (72.2 kg)  Height: 6' (1.829 m)    Physical exam: General: Vital signs reviewed.  Patient is well-developed and well-nourished, morbid obesity in no acute distress and cooperative with exam. Head: Normocephalic and atraumatic. Eyes: EOMI, conjunctivae normal, no scleral icterus. Neck: Supple, trachea midline, normal ROM, no JVD, masses, thyromegaly, or carotid bruit present. Cardiovascular: RRR, S1 normal, S2 normal, no murmurs, gallops, or rubs. Pulmonary/Chest: Clear to auscultation bilaterally, no wheezes, rales, or rhonchi. Abdominal: Soft, non-tender, non-distended, BS +, no masses, organomegaly, or guarding present. Musculoskeletal: No joint deformities, erythema, or stiffness, ROM full and nontender. Extremities: No lower extremity edema bilaterally,  pulses symmetric and intact bilaterally. No cyanosis or clubbing. Neurological: A&O x3, Strength is normal Skin: Warm, dry and intact. No rashes or erythema. Psychiatric: Normal mood and affect. speech and behavior is normal. Cognition and memory are normal.     ROS Comprehensive ROS Pertinent positive and negative noted in HPI   Last 3 Office BP readings: BP Readings from Last 3 Encounters:  08/14/22 (!) 173/72  06/13/22 132/72  05/08/22 (!) 154/71    BMET    Component Value Date/Time   NA 138 08/13/2022 1038   K 4.7 08/13/2022 1038   CL 103 08/13/2022 1038   CO2 23 08/13/2022 1038   GLUCOSE 71 08/13/2022 1038   GLUCOSE 73 02/11/2012 1020   BUN 7 (L) 08/13/2022 1038   CREATININE 0.79 08/13/2022 1038   CALCIUM 9.3 08/13/2022 1038   GFRNONAA >90 02/11/2012 1012   GFRAA >90 02/11/2012 1012    Renal function: Estimated Creatinine Clearance: 96.5 mL/min (by C-G formula based on SCr of 0.79 mg/dL).  Clinical ASCVD: Yes  The 10-year ASCVD risk score (Arnett DK, et al., 2019) is: 22.2%   Values used to calculate the score:     Age: 12 years     Sex: Male     Is  Non-Hispanic African American: Yes     Diabetic: No     Tobacco smoker: No     Systolic Blood Pressure: 173 mmHg     Is BP treated: Yes     HDL Cholesterol: 53 MG/DL     Total Cholesterol: 132 MG/DL  ASCVD risk factors include- [place]   ASSESSMENT & PLAN: Sloan was seen today for blood pressure check.  Diagnoses and all orders for this visit:  Need for immunization against influenza -     Flu Vaccine QUAD 50mo+IM (Fluarix, Fluzone & Alfiuria Quad PF)  Other orders/Medication refill -     amLODipine (NORVASC) 10 MG tablet; Take 1 tablet (10 mg total) by mouth daily. -     valsartan (DIOVAN) 80 MG tablet; Take 1 tablet (80 mg  total) by mouth daily.   Primary hypertension -Counseled on lifestyle modifications for blood pressure control including reduced dietary sodium, increased exercise, weight reduction and adequate sleep. Also, educated patient about the risk for cardiovascular events, stroke and heart attack. Also counseled patient about the importance of medication adherence. If you participate in smoking, it is important to stop using tobacco as this will increase the risks associated with uncontrolled blood pressure.    Goal BP:  For patients younger than 60: Goal BP < 130/80. For patients 60 and older: Goal BP < 140/90. For patients with diabetes: Goal BP < 130/80. Your most recent BP: 155/76  Minimize salt intake. Minimize alcohol intake    This note has been created with Education officer, environmental. Any transcriptional errors are unintentional.   Grayce Sessions, NP 08/14/2022, 8:54 AM

## 2022-08-14 NOTE — Patient Instructions (Signed)

## 2022-08-15 ENCOUNTER — Other Ambulatory Visit: Payer: 59

## 2022-09-09 ENCOUNTER — Encounter (INDEPENDENT_AMBULATORY_CARE_PROVIDER_SITE_OTHER): Payer: Self-pay | Admitting: Primary Care

## 2022-09-09 ENCOUNTER — Ambulatory Visit (INDEPENDENT_AMBULATORY_CARE_PROVIDER_SITE_OTHER): Payer: 59 | Admitting: Primary Care

## 2022-09-09 VITALS — BP 167/74 | HR 63 | Resp 16 | Wt 161.0 lb

## 2022-09-09 DIAGNOSIS — Z13220 Encounter for screening for lipoid disorders: Secondary | ICD-10-CM | POA: Diagnosis not present

## 2022-09-09 DIAGNOSIS — I10 Essential (primary) hypertension: Secondary | ICD-10-CM

## 2022-09-09 DIAGNOSIS — Z79899 Other long term (current) drug therapy: Secondary | ICD-10-CM | POA: Diagnosis not present

## 2022-09-09 MED ORDER — HYDROCHLOROTHIAZIDE 25 MG PO TABS: 25.0000 mg | ORAL_TABLET | Freq: Every day | ORAL | 3 refills | Status: DC

## 2022-09-09 NOTE — Progress Notes (Signed)
Rodney Page is a 61 y.o. male presents for hypertension evaluation, Denies shortness of breath, headaches, chest pain or lower extremity edema, sudden onset, vision changes, unilateral weakness, dizziness, paresthesias   Patient reports adherence with medications.  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   Hyperlipidemia    Hypertension    Seizure ([place]) 1998   "probably alcohol related"   Stroke [facility])    possible stroke 2009 per wife   Stroke ([place]) 02/2012   residual left sided weakness (leg); "said he's had lots of min strokes before this too" (08/24/2012)   Past Surgical History:  Procedure Laterality Date   PATENT FORAMEN OVALE CLOSURE  08/24/2012   PATENT FORAMEN OVALE CLOSURE Bilateral 08/24/2012   Procedure: PATENT FORAMEN OVALE CLOSURE;  Surgeon: Laverda Page, MD;  Location: [place] CATH LAB;  Service: Cardiovascular;  Laterality: Bilateral;   PATENT FORAMEN OVALE CLOSURE N/A 09/13/2013   Procedure: Cyndy Freeze;  Surgeon: Laverda Page, MD;  Location: [facility] CATH LAB;  Service: Cardiovascular;  Laterality: N/A;   TEE WITHOUT CARDIOVERSION  02/13/2012   Procedure: TRANSESOPHAGEAL ECHOCARDIOGRAM (TEE);  Surgeon: Thayer Headings, MD;  Location: [place];  Service: Cardiovascular;  Laterality: N/A;   TEE WITHOUT CARDIOVERSION N/A 09/05/2014   Procedure: TRANSESOPHAGEAL ECHOCARDIOGRAM (TEE);  Surgeon: Laverda Page, MD;  Location: [facility] ENDOSCOPY;  Service: Cardiovascular;  Laterality: N/A;   Allergies  Allergen Reactions   Iodinated Contrast Media Other (See Comments)    MRI DYE; "face and skin; broke out in hives and swelling"    Current Outpatient Medications on File Prior to Visit  Medication Sig Dispense Refill   amLODipine (NORVASC) 10 MG tablet Take 1 tablet (10 mg total) by mouth daily. 90 tablet 1   aspirin 81 MG tablet Take 81 mg by mouth daily.     Cholecalciferol (VITAMIN D3 PO) Take 1 tablet by mouth daily at 12 noon.     Cobalamin Combinations (B-12) [phone] MCG SUBL Place under the tongue.     Cyanocobalamin (VITAMIN B 12 PO) Take by mouth daily.     Multiple Vitamin (MULITIVITAMIN WITH MINERALS) TABS Take 1 tablet by mouth daily.     valsartan (DIOVAN) 80 MG tablet Take 1 tablet (80 mg total) by mouth daily. 90 tablet 1   No current facility-administered medications on file prior to visit.   Social History   Socioeconomic History   Marital status: Married    Spouse name: Sonya   Number of children: 0   Years of education: college   Highest education level: Not on file  Occupational History   Not on file  Tobacco Use   Smoking status: Former    Packs/day: 1.00    Years: 20.00    Total pack years: 20.00    Types: Cigarettes    Quit date: 12/08/1997    Years since quitting: 24.7   Smokeless tobacco: Never  Substance and Sexual Activity   Alcohol use: No    Alcohol/week: 0.0 standard drinks of alcohol   Drug use: Yes    Types: Marijuana    Comment: OCC. once per week   Sexual  activity: Yes  Other Topics Concern   Not on file  Social History Narrative   Patient lives at home with his wife Davy Pique).   Patient works full time [place].   Education college.   Right handed.   Caffeine One two liter of mountain dew daily.   Social Determinants of Health   Financial Resource Strain: Not on file  Food Insecurity: Not on file  Transportation Needs: Not on file  Physical Activity: Not on file  Stress: Not on file  Social Connections: Not on file  Intimate Partner Violence: Not on file   Family History  Problem Relation Age of Onset   Heart disease Mother     Hypertension Sister    Hypertension Brother      OBJECTIVE:  Vitals:   09/09/22 0840  BP: (!) 167/74  Pulse: 63  Resp: 16  SpO2: 100%  Weight: 161 lb 0.6 oz (73 kg)    Physical exam: General: Vital signs reviewed.  Patient is well-developed and well-nourished,under weight male in no acute distress and cooperative with exam. Head: Normocephalic and atraumatic. Eyes: EOMI, conjunctivae normal, no scleral icterus. Neck: Supple, trachea midline, normal ROM, no JVD, masses, thyromegaly, or carotid bruit present. Cardiovascular: RRR, S1 normal, S2 normal, no murmurs, gallops, or rubs. Pulmonary/Chest: Clear to auscultation bilaterally, no wheezes, rales, or rhonchi. Abdominal: Soft, non-tender, non-distended, BS +, no masses, organomegaly, or guarding present. Musculoskeletal: No joint deformities, erythema, or stiffness, ROM full and nontender. Extremities: No lower extremity edema bilaterally,  pulses symmetric and intact bilaterally. No cyanosis or clubbing. Neurological: A&O x3, Strength is normal Skin: Warm, dry and intact. No rashes or erythema. Psychiatric: Normal mood and affect. speech and behavior is normal. Cognition and memory are normal.     ROS Comprehensive ROS Pertinent positive and negative noted in HPI   Last 3 Office BP readings: BP Readings from Last 3 Encounters:  09/09/22 (!) 167/74  08/14/22 (!) 155/76  06/13/22 132/72    BMET    Component Value Date/Time   NA 138 08/13/2022 1038   K 4.7 08/13/2022 1038   CL 103 08/13/2022 1038   CO2 23 08/13/2022 1038   GLUCOSE 71 08/13/2022 1038   GLUCOSE 73 02/11/2012 1020   BUN 7 (L) 08/13/2022 1038   CREATININE 0.79 08/13/2022 1038   CALCIUM 9.3 08/13/2022 1038   GFRNONAA >90 02/11/2012 1012   GFRAA >90 02/11/2012 1012    Renal function: CrCl cannot be calculated (Patient's most recent lab result is older than the maximum 21 days allowed.).  Clinical ASCVD: Yes  The 10-year ASCVD risk score (Arnett DK,  et al., 2019) is: 20.9%   Values used to calculate the score:     Age: 52 years     Sex: Male     Is Non-Hispanic African American: Yes     Diabetic: No     Tobacco smoker: No     Systolic Blood Pressure: 160 mmHg     Is BP treated: Yes     HDL Cholesterol: 53 MG/DL     Total Cholesterol: 132 MG/DL  ASCVD risk factors include- [place]   ASSESSMENT & PLAN: Carry was seen today for blood pressure check.  Diagnoses and all orders for this visit:    Lipid screening HX of CVA  Healthy lifestyle diet of fruits vegetables fish nuts whole grains and low saturated fat . Foods high in cholesterol or liver, fatty meats,cheese, butter avocados, nuts and seeds, chocolate and fried  foods.  Medication management CBC/ CMP    Essential hypertension -Counseled on lifestyle modifications for blood pressure control including reduced dietary sodium, increased exercise, weight reduction and adequate sleep. Also, educated patient about the risk for cardiovascular events, stroke and heart attack. Also counseled patient about the importance of medication adherence. If you participate in smoking, it is important to stop using tobacco as this will increase the risks associated with uncontrolled blood pressure.   Goal BP:  For patients younger than 60: Goal BP < 130/80. For patients 60 and older: Goal BP < 140/90. For patients with diabetes: Goal BP < 130/80. Your most recent BP: 167/74  Minimize salt intake. Minimize alcohol intake Added HCTZ 25mg  Continue amlodipine 10mg  and Valsartan 80   This note has been created with Surveyor, quantity. Any transcriptional errors are unintentional.   Kerin Perna, NP 09/09/2022, 8:56 AM

## 2022-09-09 NOTE — Patient Instructions (Signed)
Hydrochlorothiazide Capsules or Tablets What is this medication? HYDROCHLOROTHIAZIDE (hye droe klor oh THYE a zide) treats high blood pressure. It may also be used to reduce swelling related to heart, kidney, or liver disease. It helps your kidneys remove more fluid and salt from your blood through the urine. It belongs to a group of medications called diuretics. This medicine may be used for other purposes; ask your health care provider or pharmacist if you have questions. COMMON BRAND NAME(S): Esidrix, Ezide, HydroDIURIL, Microzide, Oretic, Zide What should I tell my care team before I take this medication? They need to know if you have any of these conditions: Diabetes Gout Kidney disease Liver disease Lupus Pancreatitis An unusual or allergic reaction to hydrochlorothiazide, sulfa drugs, other medications, foods, dyes, or preservatives Pregnant or trying to get pregnant Breast-feeding How should I use this medication? Take this medication by mouth. Take it as directed on the prescription label at the same time every day. You can take it with or without food. If it upsets your stomach, take it with food. Keep taking it unless your care team tells you to stop. Talk to your care team about the use of this medication in children. While it may be prescribed for children as young as newborns for selected conditions, precautions do apply. Overdosage: If you think you have taken too much of this medicine contact a poison control center or emergency room at once. NOTE: This medicine is only for you. Do not share this medicine with others. What if I miss a dose? If you miss a dose, take it as soon as you can. If it is almost time for your next dose, take only that dose. Do not take double or extra doses. What may interact with this medication? Cholestyramine Colestipol Digoxin Dofetilide Lithium Medications for blood pressure Medications for diabetes Medications that relax muscles for  surgery Other diuretics Steroid medications like prednisone or cortisone This list may not describe all possible interactions. Give your health care provider a list of all the medicines, herbs, non-prescription drugs, or dietary supplements you use. Also tell them if you smoke, drink alcohol, or use illegal drugs. Some items may interact with your medicine. What should I watch for while using this medication? Visit your health care provider for regular check-ups. Check your blood pressure as directed. Ask your health care provider what your blood pressure should be. Also, find out when you should contact him or her. Do not treat yourself for coughs, colds, or pain while you are using this medication without asking your health care provider for advice. Some medications may increase your blood pressure. You may get drowsy or dizzy. Do not drive, use machinery, or do anything that needs mental alertness until you know how this medication affects you. Do not stand or sit up quickly, especially if you are an older patient. This reduces the risk of dizzy or fainting spells. Alcohol can make you more drowsy and dizzy. Avoid alcoholic drinks. Talk to your health care professional about your risk of skin cancer. You may be more at risk for skin cancer if you take this medication. This medication can make you more sensitive to the sun. Keep out of the sun. If you cannot avoid being in the sun, wear protective clothing and use sunscreen. Do not use sun lamps or tanning beds/booths. You may need to be on a special diet while taking this medication. Ask your health care provider. Also, find out how many glasses of fluids you   need to drink each day. Check with your health care provider if you get an attack of severe diarrhea, nausea and vomiting, or if you sweat a lot. The loss of too much body fluid can make it dangerous for you to take this medication. This medication may increase blood sugar. Ask your healthcare  provider if changes in diet or medications are needed if you have diabetes. What side effects may I notice from receiving this medication? Side effects that you should report to your care team as soon as possible: Allergic reactions--skin rash, itching, hives, swelling of the face, lips, tongue, or throat Dehydration--increased thirst, dry mouth, feeling faint or lightheaded, headache, dark yellow or brown urine Gout--severe pain, redness, warmth, or swelling in joints, such as the big toe Kidney injury--decrease in the amount of urine, swelling of the ankles, hands, or feet Low blood pressure--dizziness, feeling faint or lightheaded, blurry vision Low potassium level--muscle pain or cramps, unusual weakness, fatigue, fast or irregular heartbeat, constipation Sudden eye pain or change in vision such as blurred vision, seeing halos around lights, vision loss Side effects that usually do not require medical attention (report to your care team if they continue or are bothersome): Change in sex drive or performance Headache Upset stomach This list may not describe all possible side effects. Call your doctor for medical advice about side effects. You may report side effects to FDA at 1-800-FDA-1088. Where should I keep my medication? Keep out of the reach of children and pets. Store at room temperature between 20 and 25 degrees C (68 and 77 degrees F). Protect from light and moisture. Keep the container tightly closed. Do not freeze. Get rid of any unused medication after the expiration date. To get rid of medications that are no longer needed or have expired: Take the medication to a medication take-back program. Check with your pharmacy or law enforcement to find a location. If you cannot return the medication, check the label or package insert to see if the medication should be thrown out in the garbage or flushed down the toilet. If you are not sure, ask your care team. If it is safe to put in the  trash, empty the medication out of the container. Mix the medication with cat litter, dirt, coffee grounds, or other unwanted substance. Seal the mixture in a bag or container. Put it in the trash. NOTE: This sheet is a summary. It may not cover all possible information. If you have questions about this medicine, talk to your doctor, pharmacist, or health care provider.  2023 Elsevier/Gold Standard (2008-01-15 00:00:00)  

## 2022-09-10 LAB — CBC WITH DIFFERENTIAL/PLATELET
Basophils Absolute: 0 10*3/uL (ref 0.0–0.2)
Basos: 1 %
EOS (ABSOLUTE): 0.1 10*3/uL (ref 0.0–0.4)
Eos: 2 %
Hematocrit: 42.5 % (ref 37.5–51.0)
Hemoglobin: 14.1 g/dL (ref 13.0–17.7)
Immature Grans (Abs): 0 10*3/uL (ref 0.0–0.1)
Immature Granulocytes: 0 %
Lymphocytes Absolute: 1.4 10*3/uL (ref 0.7–3.1)
Lymphs: 26 %
MCH: 28.6 pg (ref 26.6–33.0)
MCHC: 33.2 g/dL (ref 31.5–35.7)
MCV: 86 fL (ref 79–97)
Monocytes Absolute: 0.6 10*3/uL (ref 0.1–0.9)
Monocytes: 10 %
Neutrophils Absolute: 3.4 10*3/uL (ref 1.4–7.0)
Neutrophils: 61 %
Platelets: 260 10*3/uL (ref 150–450)
RBC: 4.93 x10E6/uL (ref 4.14–5.80)
RDW: 13.1 % (ref 11.6–15.4)
WBC: 5.5 10*3/uL (ref 3.4–10.8)

## 2022-09-10 LAB — LIPID PANEL
Chol/HDL Ratio: 2.5 ratio (ref 0.0–5.0)
Cholesterol, Total: 135 mg/dL (ref 100–199)
HDL: 54 mg/dL (ref >39)
LDL Chol Calc (NIH): 67 mg/dL (ref 0–99)
Triglycerides: 72 mg/dL (ref 0–149)
VLDL Cholesterol Cal: 14 mg/dL (ref 5–40)

## 2022-10-07 ENCOUNTER — Telehealth: Payer: 59 | Admitting: Family Medicine

## 2022-10-07 DIAGNOSIS — J069 Acute upper respiratory infection, unspecified: Secondary | ICD-10-CM | POA: Diagnosis not present

## 2022-10-07 MED ORDER — FLUTICASONE PROPIONATE 50 MCG/ACT NA SUSP: 2.0000 | Freq: Every day | NASAL | 0 refills | Status: DC

## 2022-10-07 MED ORDER — BENZONATATE 100 MG PO CAPS: 200.0000 mg | ORAL_CAPSULE | Freq: Two times a day (BID) | ORAL | 0 refills | Status: DC | PRN

## 2022-10-07 NOTE — Progress Notes (Signed)

## 2022-10-14 ENCOUNTER — Ambulatory Visit (INDEPENDENT_AMBULATORY_CARE_PROVIDER_SITE_OTHER): Payer: 59

## 2022-10-14 VITALS — BP 130/78

## 2022-10-14 DIAGNOSIS — I10 Essential (primary) hypertension: Secondary | ICD-10-CM

## 2023-01-28 ENCOUNTER — Other Ambulatory Visit (INDEPENDENT_AMBULATORY_CARE_PROVIDER_SITE_OTHER): Payer: Self-pay | Admitting: Primary Care

## 2023-01-28 NOTE — Telephone Encounter (Signed)
Requested Prescriptions  Pending Prescriptions Disp Refills   amLODipine (NORVASC) 10 MG tablet [Pharmacy Med Name: amLODIPine Besylate 10 MG Oral Tablet] 90 tablet 0    Sig: Take 1 tablet by mouth once daily     Cardiovascular: Calcium Channel Blockers 2 Passed - 01/28/2023  4:23 PM      Passed - Last BP in normal range    BP Readings from Last 1 Encounters:  10/14/22 130/78         Passed - Last Heart Rate in normal range    Pulse Readings from Last 1 Encounters:  09/09/22 63         Passed - Valid encounter within last 6 months    Recent Outpatient Visits           4 months ago Essential hypertension   [place], [place] P, NP   5 months ago Need for immunization against influenza   [place], Michelle P, NP   8 months ago 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)   [place] Kerin Perna, NP

## 2023-01-29 NOTE — Telephone Encounter (Signed)
Requested Prescriptions  Pending Prescriptions Disp Refills   valsartan (DIOVAN) 80 MG tablet [Pharmacy Med Name: Valsartan 80 MG Oral Tablet] 90 tablet 0    Sig: Take 1 tablet by mouth once daily     Cardiovascular:  Angiotensin Receptor Blockers Passed - 01/28/2023  8:26 PM      Passed - Cr in normal range and within 180 days    Creatinine, Ser  Date Value Ref Range Status  08/13/2022 0.79 0.76 - 1.27 mg/dL Final   Creatinine,U  Date Value Ref Range Status  02/12/2012 185.7 mg/dL Final    Comment:    (NOTE) Cutoff Values for Urine Drug Screen:        Drug Class           Cutoff (ng/mL)        Amphetamines            1000        Barbiturates             200        Cocaine Metabolites      300        Benzodiazepines          200        Methadone                300        Opiates                 2000        Phencyclidine             25        Propoxyphene             300        Marijuana Metabolites     50 For medical purposes only.         Passed - K in normal range and within 180 days    Potassium  Date Value Ref Range Status  08/13/2022 4.7 3.5 - 5.2 mmol/L Final         Passed - Patient is not pregnant      Passed - Last BP in normal range    BP Readings from Last 1 Encounters:  10/14/22 130/78         Passed - Valid encounter within last 6 months    Recent Outpatient Visits           4 months ago Essential hypertension   [place], [place] P, NP   5 months ago Need for immunization against influenza   [place], Michelle P, NP   8 months ago Carotid artery aneurysm [facility])   [place] Kerin Perna, NP

## 2023-05-04 ENCOUNTER — Other Ambulatory Visit (INDEPENDENT_AMBULATORY_CARE_PROVIDER_SITE_OTHER): Payer: Self-pay | Admitting: Primary Care

## 2023-05-06 ENCOUNTER — Other Ambulatory Visit (INDEPENDENT_AMBULATORY_CARE_PROVIDER_SITE_OTHER): Payer: Self-pay | Admitting: Primary Care

## 2023-05-12 ENCOUNTER — Other Ambulatory Visit (INDEPENDENT_AMBULATORY_CARE_PROVIDER_SITE_OTHER): Payer: Self-pay | Admitting: Primary Care

## 2023-05-13 ENCOUNTER — Other Ambulatory Visit (INDEPENDENT_AMBULATORY_CARE_PROVIDER_SITE_OTHER): Payer: Self-pay | Admitting: Primary Care

## 2023-05-13 NOTE — Telephone Encounter (Signed)
Left a voicemail for pt to call in and make an appt. For his check up for his medication refills. A 30 day courtesy supply given of the amlodipine and valsartan.

## 2023-05-28 ENCOUNTER — Ambulatory Visit (INDEPENDENT_AMBULATORY_CARE_PROVIDER_SITE_OTHER): Payer: 59 | Admitting: Primary Care

## 2023-06-08 ENCOUNTER — Encounter (INDEPENDENT_AMBULATORY_CARE_PROVIDER_SITE_OTHER): Payer: Self-pay | Admitting: Primary Care

## 2023-06-08 ENCOUNTER — Ambulatory Visit (INDEPENDENT_AMBULATORY_CARE_PROVIDER_SITE_OTHER): Payer: 59 | Admitting: Primary Care

## 2023-06-08 VITALS — BP 155/81 | HR 94 | Resp 16 | Ht 72.0 in | Wt 155.2 lb

## 2023-06-08 DIAGNOSIS — Z760 Encounter for issue of repeat prescription: Secondary | ICD-10-CM

## 2023-06-08 DIAGNOSIS — I10 Essential (primary) hypertension: Secondary | ICD-10-CM | POA: Diagnosis not present

## 2023-06-08 MED ORDER — VALSARTAN 80 MG PO TABS: 80.0000 mg | ORAL_TABLET | Freq: Every day | ORAL | 1 refills | Status: DC

## 2023-06-08 MED ORDER — HYDROCHLOROTHIAZIDE 25 MG PO TABS: 25.0000 mg | ORAL_TABLET | Freq: Every day | ORAL | 1 refills | Status: DC

## 2023-06-08 MED ORDER — AMLODIPINE BESYLATE 10 MG PO TABS: 10.0000 mg | ORAL_TABLET | Freq: Every day | ORAL | 1 refills | Status: DC

## 2023-06-08 NOTE — Progress Notes (Unsigned)
[facility]  Rodney Page, is a 62 y.o. male  IHK:[id number]  [id number]:[id number]  DOB - September 22, 1961  Chief Complaint  Patient presents with   Hypertension       Subjective:   Rodney Page is a 62 y.o. male here today for a follow up visit. Patient has No headache, No chest pain, No abdominal pain - No Nausea, No new weakness tingling or numbness, No Cough - shortness of breath  No problems updated.  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     benzonatate (TESSALON) 100 MG capsule Take 2 capsules (200 mg total) by mouth 2 (two) times daily as needed for cough. 20 capsule 0   Cholecalciferol (VITAMIN D3 PO) Take 1 tablet by mouth daily at 12 noon.     Cobalamin Combinations (B-12) [phone] MCG SUBL Place under the tongue.     Cyanocobalamin (VITAMIN B 12 PO) Take by mouth daily.     fluticasone (FLONASE) 50 MCG/ACT nasal spray Place 2 sprays into both nostrils daily. 16 g 0   hydrochlorothiazide (HYDRODIURIL) 25 MG tablet Take 1 tablet (25 mg total) by mouth daily. 90 tablet 3   Multiple Vitamin (MULITIVITAMIN WITH MINERALS) TABS Take 1 tablet by mouth daily.     valsartan (DIOVAN) 80 MG tablet Take 1 tablet by mouth once daily 30 tablet 0   No current facility-administered  medications on file prior to visit.    Objective:  Blood Pressure (Abnormal) 155/81 (BP Location: Right Arm, Patient Position: Sitting, Cuff Size: Normal)   Pulse 94   Respiration 16   Height 6' (1.829 m)   Weight 155 lb 3.2 oz (70.4 kg)   Oxygen Saturation 97%   Body Mass Index 21.05 kg/m     Comprehensive ROS Pertinent positive and negative noted in HPI   Exam General appearance : Awake, alert, not in any distress. Speech Clear. Not toxic looking HEENT: Atraumatic and Normocephalic, pupils equally reactive to light and accomodation Neck: Supple, no JVD. No cervical lymphadenopathy.  Chest: Good air entry bilaterally, no added sounds  CVS: S1 S2 regular, no murmurs.  Abdomen: Bowel sounds present, Non tender and not distended with no gaurding, rigidity or rebound. Extremities: B/L Lower Ext shows no edema, both legs are warm to touch Neurology: Awake alert, and oriented X 3, CN II-XII intact, Non focal Skin: No Rash  Data Review Lab Results  Component Value Date   HGBA1C 5.3 05/08/2022   HGBA1C 5.7 (H) 02/12/2012   HGBA1C 5.7 (H) 02/11/2012    Assessment & Plan   There are no diagnoses linked to this encounter. There are no diagnoses linked to this encounter.   Patient have been  counseled extensively about nutrition and exercise. Other issues discussed during this visit include: low cholesterol diet, weight control and daily exercise, foot care, annual eye examinations at Ophthalmology, importance of adherence with medications and regular follow-up. We also discussed long term complications of uncontrolled diabetes and hypertension.   No follow-ups on file.  The patient was given clear instructions to go to ER or return to medical center if symptoms don't improve, worsen or new problems develop. The patient verbalized understanding. The patient was told to call to get lab results if they haven't heard anything in the next week.   This note has been created with Biomedical engineer. Any transcriptional errors are unintentional.   Rodney Sessions, NP 06/08/2023, 3:38 PM

## 2023-07-01 ENCOUNTER — Ambulatory Visit (INDEPENDENT_AMBULATORY_CARE_PROVIDER_SITE_OTHER): Payer: 59

## 2023-07-01 VITALS — BP 133/67

## 2023-07-01 DIAGNOSIS — Z0130 Encounter for examination of blood pressure without abnormal findings: Secondary | ICD-10-CM

## 2023-10-07 ENCOUNTER — Telehealth (INDEPENDENT_AMBULATORY_CARE_PROVIDER_SITE_OTHER): Payer: Self-pay | Admitting: Primary Care

## 2023-10-07 NOTE — Telephone Encounter (Signed)
Called to remind pt about apt but phone was unavailable.

## 2023-10-08 ENCOUNTER — Ambulatory Visit (INDEPENDENT_AMBULATORY_CARE_PROVIDER_SITE_OTHER): Payer: 59 | Admitting: Primary Care

## 2023-10-08 VITALS — BP 157/65 | HR 62 | Resp 16 | Ht 72.0 in | Wt 149.6 lb

## 2023-10-08 DIAGNOSIS — R634 Abnormal weight loss: Secondary | ICD-10-CM

## 2023-10-08 DIAGNOSIS — I10 Essential (primary) hypertension: Secondary | ICD-10-CM | POA: Diagnosis not present

## 2023-10-08 DIAGNOSIS — I639 Cerebral infarction, unspecified: Secondary | ICD-10-CM | POA: Diagnosis not present

## 2023-10-08 DIAGNOSIS — Z23 Encounter for immunization: Secondary | ICD-10-CM | POA: Diagnosis not present

## 2023-10-08 DIAGNOSIS — Z1159 Encounter for screening for other viral diseases: Secondary | ICD-10-CM

## 2023-10-08 MED ORDER — CHLORTHALIDONE 50 MG PO TABS: 50.0000 mg | ORAL_TABLET | Freq: Every day | ORAL | 1 refills | Status: DC

## 2023-10-08 NOTE — Progress Notes (Signed)
[facility]  Rodney Page, is a 62 y.o. male  UJW:[id number]  [id number]  DOB - 1961-11-09  Chief Complaint  Patient presents with   Hypertension   Medication Refill       Subjective:   Rodney Page is a 62 y.o. male here today for a follow up visit HTN. Patient has No headache, No chest pain, No abdominal pain - No Nausea, No new weakness tingling or numbness, No Cough - shortness of breath  No problems updated.  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 90 tablet 1   aspirin 81 MG tablet Take 81 mg by mouth daily.     Cholecalciferol (VITAMIN D3 PO) Take 1 tablet by mouth daily at 12 noon.     Cobalamin Combinations (B-12) [phone] MCG SUBL Place under the tongue.     Cyanocobalamin (VITAMIN B 12 PO) Take by mouth daily.     Multiple Vitamin (MULITIVITAMIN WITH MINERALS) TABS Take 1 tablet by mouth daily.     valsartan (DIOVAN) 80 MG tablet Take 1 tablet (80 mg total) by mouth daily. 90 tablet 1   No current facility-administered medications on file prior to visit.    Objective:  BP (!) 157/65 (BP Location: Left Arm, Patient Position: Sitting, Cuff Size: Normal)   Pulse 62   Resp 16   Ht 6' (1.829 m)   Wt 149 lb 9.6 oz (67.9 kg)   SpO2 100%   BMI 20.29 kg/m    Comprehensive ROS Pertinent positive and negative  noted in HPI   Exam General appearance : Awake, alert, not in any distress. Speech Clear. Not toxic looking HEENT: Atraumatic and Normocephalic, pupils equally reactive to light and accomodation Neck: Supple, no JVD. No cervical lymphadenopathy.  Chest: Good air entry bilaterally, no added sounds  CVS: S1 S2 regular, no murmurs.  Abdomen: Bowel sounds present, Non tender and not distended with no gaurding, rigidity or rebound. Extremities: B/L Lower Ext shows no edema, both legs are warm to touch Neurology: Awake alert, and oriented X 3, CN II-XII intact, Non focal Skin: No Rash  Data Review Lab Results  Component Value Date   HGBA1C 5.3 05/08/2022   HGBA1C 5.7 (H) 02/12/2012   HGBA1C 5.7 (H) 02/11/2012    Assessment & Plan  Rodney Page was seen today for hypertension and medication refill.  Diagnoses and all orders for this visit:  Encounter for immunization -     Flu vaccine trivalent PF, 6mos and older(Flulaval,Afluria,Fluarix,Fluzone)  Primary hypertension BP goal - < 140/90Explained that having normal blood pressure is the goal and medications are helping to get to goal and maintain normal blood pressure. DIET: Limit salt intake, read nutrition labels to check salt content, limit fried and high fatty foods  Avoid  using multisymptom OTC cold preparations that generally contain sudafed which can rise BP. Consult with pharmacist on best cold relief products to use for persons with HTN EXERCISE Discussed incorporating exercise such as walking - 30 minutes most days of the week and can do in 10 minute intervals    Remains elevated d/c hydrochlorothiazide replace with chlorthalidone  -     CMP14+EGFR  Cerebrovascular accident (CVA), unspecified mechanism (HCC) -     CBC with Differential/Platelet -     CMP14+EGFR -     Lipid panel  Encounter for HCV screening test for low risk patient -     HCV Ab w Reflex to Quant PCR  Unintentional weight loss -     TSH + free T4  Other  orders -     chlorthalidone (HYGROTON) 50 MG tablet; Take 1 tablet (50 mg total) by mouth daily.    Patient have been counseled extensively about nutrition and exercise. Other issues discussed during this visit include: low cholesterol diet, weight control and daily exercise, foot care, annual eye examinations at Ophthalmology, importance of adherence with medications and regular follow-up. We also discussed long term complications of uncontrolled diabetes and hypertension.   Return in about 3 months (around 01/08/2024) for medical conditions.  The patient was given clear instructions to go to ER or return to medical center if symptoms don't improve, worsen or new problems develop. The patient verbalized understanding. The patient was told to call to get lab results if they haven't heard anything in the next week.   This note has been created with Education officer, environmental. Any transcriptional errors are unintentional.   Grayce Sessions, NP 10/08/2023, 4:20 PM

## 2023-10-08 NOTE — Patient Instructions (Signed)

## 2023-10-14 ENCOUNTER — Other Ambulatory Visit (INDEPENDENT_AMBULATORY_CARE_PROVIDER_SITE_OTHER): Payer: 59

## 2023-10-14 ENCOUNTER — Encounter (INDEPENDENT_AMBULATORY_CARE_PROVIDER_SITE_OTHER): Payer: Self-pay

## 2023-11-02 ENCOUNTER — Other Ambulatory Visit (INDEPENDENT_AMBULATORY_CARE_PROVIDER_SITE_OTHER): Payer: 59

## 2023-11-03 LAB — LIPID PANEL
Chol/HDL Ratio: 2.3 {ratio} (ref 0.0–5.0)
Cholesterol, Total: 135 mg/dL (ref 100–199)
HDL: 60 mg/dL (ref >39)
LDL Chol Calc (NIH): 60 mg/dL (ref 0–99)
Triglycerides: 77 mg/dL (ref 0–149)
VLDL Cholesterol Cal: 15 mg/dL (ref 5–40)

## 2023-11-03 LAB — CMP14+EGFR
ALT: 14 [IU]/L (ref 0–44)
AST: 18 [IU]/L (ref 0–40)
Albumin: 4.6 g/dL (ref 3.9–4.9)
Alkaline Phosphatase: 94 [IU]/L (ref 44–121)
BUN/Creatinine Ratio: 12 (ref 10–24)
BUN: 9 mg/dL (ref 8–27)
Bilirubin Total: 0.4 mg/dL (ref 0.0–1.2)
CO2: 25 mmol/L (ref 20–29)
Calcium: 9.6 mg/dL (ref 8.6–10.2)
Chloride: 99 mmol/L (ref 96–106)
Creatinine, Ser: 0.77 mg/dL (ref 0.76–1.27)
Globulin, Total: 2.5 g/dL (ref 1.5–4.5)
Glucose: 82 mg/dL (ref 70–99)
Potassium: 4.2 mmol/L (ref 3.5–5.2)
Sodium: 138 mmol/L (ref 134–144)
Total Protein: 7.1 g/dL (ref 6.0–8.5)
eGFR: 100 mL/min/{1.73_m2} (ref >59)

## 2023-11-03 LAB — CBC WITH DIFFERENTIAL/PLATELET
Basophils Absolute: 0 10*3/uL (ref 0.0–0.2)
Basos: 0 %
EOS (ABSOLUTE): 0.1 10*3/uL (ref 0.0–0.4)
Eos: 1 %
Hematocrit: 39.8 % (ref 37.5–51.0)
Hemoglobin: 13.1 g/dL (ref 13.0–17.7)
Immature Grans (Abs): 0 10*3/uL (ref 0.0–0.1)
Immature Granulocytes: 0 %
Lymphocytes Absolute: 2.2 10*3/uL (ref 0.7–3.1)
Lymphs: 29 %
MCH: 29.4 pg (ref 26.6–33.0)
MCHC: 32.9 g/dL (ref 31.5–35.7)
MCV: 89 fL (ref 79–97)
Monocytes Absolute: 0.6 10*3/uL (ref 0.1–0.9)
Monocytes: 8 %
Neutrophils Absolute: 4.6 10*3/uL (ref 1.4–7.0)
Neutrophils: 62 %
Platelets: 262 10*3/uL (ref 150–450)
RBC: 4.45 x10E6/uL (ref 4.14–5.80)
RDW: 12.4 % (ref 11.6–15.4)
WBC: 7.6 10*3/uL (ref 3.4–10.8)

## 2023-11-03 LAB — HCV INTERPRETATION

## 2023-11-03 LAB — HCV AB W REFLEX TO QUANT PCR: HCV Ab: NONREACTIVE

## 2023-11-03 LAB — TSH+FREE T4
Free T4: 1.07 ng/dL (ref 0.82–1.77)
TSH: 1.32 u[IU]/mL (ref 0.450–4.500)

## 2023-12-05 ENCOUNTER — Other Ambulatory Visit (INDEPENDENT_AMBULATORY_CARE_PROVIDER_SITE_OTHER): Payer: Self-pay | Admitting: Primary Care

## 2023-12-06 ENCOUNTER — Other Ambulatory Visit (INDEPENDENT_AMBULATORY_CARE_PROVIDER_SITE_OTHER): Payer: Self-pay | Admitting: Primary Care

## 2023-12-07 ENCOUNTER — Other Ambulatory Visit (INDEPENDENT_AMBULATORY_CARE_PROVIDER_SITE_OTHER): Payer: Self-pay | Admitting: Primary Care

## 2023-12-07 MED ORDER — VALSARTAN 80 MG PO TABS: 80.0000 mg | ORAL_TABLET | Freq: Every day | ORAL | 0 refills | Status: DC

## 2023-12-07 MED ORDER — AMLODIPINE BESYLATE 10 MG PO TABS: 10.0000 mg | ORAL_TABLET | Freq: Every day | ORAL | 0 refills | Status: DC

## 2023-12-07 MED ORDER — CHLORTHALIDONE 50 MG PO TABS: 50.0000 mg | ORAL_TABLET | Freq: Every day | ORAL | 0 refills | Status: DC

## 2024-01-11 ENCOUNTER — Encounter (INDEPENDENT_AMBULATORY_CARE_PROVIDER_SITE_OTHER): Payer: Self-pay | Admitting: Primary Care

## 2024-01-11 ENCOUNTER — Ambulatory Visit (INDEPENDENT_AMBULATORY_CARE_PROVIDER_SITE_OTHER): Payer: 59 | Admitting: Primary Care

## 2024-01-11 VITALS — BP 150/72 | HR 60 | Resp 16 | Ht 72.0 in | Wt 145.6 lb

## 2024-01-11 DIAGNOSIS — I10 Essential (primary) hypertension: Secondary | ICD-10-CM | POA: Diagnosis not present

## 2024-01-11 DIAGNOSIS — Z2821 Immunization not carried out because of patient refusal: Secondary | ICD-10-CM | POA: Diagnosis not present

## 2024-01-11 MED ORDER — CHLORTHALIDONE 50 MG PO TABS: 50.0000 mg | ORAL_TABLET | Freq: Every day | ORAL | 1 refills | Status: AC

## 2024-01-11 MED ORDER — AMLODIPINE BESYLATE 10 MG PO TABS: 10.0000 mg | ORAL_TABLET | Freq: Every day | ORAL | 1 refills | Status: AC

## 2024-01-11 MED ORDER — VALSARTAN 80 MG PO TABS: 80.0000 mg | ORAL_TABLET | Freq: Every day | ORAL | 1 refills | Status: AC

## 2024-01-11 NOTE — Progress Notes (Signed)
[facility]  Rodney Page, is a 63 y.o. male  [id number]  ZYS:[id number]  DOB - Jan 10, 1961  Chief Complaint  Patient presents with   Hypertension       Subjective:   Rodney Page is a 63 y.o. male here today for a follow up visit HTN.  Patient has No headache, No chest pain, No abdominal pain - No Nausea, No new weakness tingling or numbness, No Cough - shortness of breath .  Question the patient why was his systolic elevated recheck blood pressure 150/72.  He admits to drinking 1 (2 liter ) of [employer] a day.  We reviewed the caffeine content and [facility] and he is aware also knows it can keep blood pressure elevated. Hypertension This is a chronic problem. The current episode started more than 1 year ago. The problem has been waxing and waning since onset. The problem is uncontrolled. (2 liter Mt. Dew daily) There are no associated agents to hypertension. Risk factors for coronary artery disease include male gender. Past treatments include diuretics and calcium channel blockers. The current treatment provides mild improvement. There are no compliance problems.     No problems updated.  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 90 tablet 0   aspirin 81 MG  tablet Take 81 mg by mouth daily.     chlorthalidone (HYGROTON) 50 MG tablet Take 1 tablet (50 mg total) by mouth daily. 90 tablet 0   Cholecalciferol (VITAMIN D3 PO) Take 1 tablet by mouth daily at 12 noon.     Cobalamin Combinations (B-12) [phone] MCG SUBL Place under the tongue.     Cyanocobalamin (VITAMIN B 12 PO) Take by mouth daily.     Multiple Vitamin (MULITIVITAMIN WITH MINERALS) TABS Take 1 tablet by mouth daily.     valsartan (DIOVAN) 80 MG tablet Take 1 tablet (80 mg total) by mouth daily. Needs appt with PCP or cardiology 90 tablet 0   No current facility-administered medications on file prior to visit.   Health Maintenance  Topic Date Due   COVID-19 Vaccine (1) Never done   Zoster (Shingles) Vaccine (1 of 2) 04/09/2024*   Colon Cancer Screening  01/06/2026   DTaP/Tdap/Td vaccine (2 - Td or Tdap) 04/03/2031   Flu Shot  Completed   Hepatitis C Screening  Completed   HIV Screening  Completed   HPV Vaccine  Aged Out  *Topic was postponed. The date shown is not the original due date.    Objective:   Vitals:   01/11/24 1612 01/11/24 1613 01/11/24 1628  BP: (!) 167/76 (!) 161/75 (!) 150/72  Pulse: 60    Resp: 16    SpO2: 97%  Weight: 145 lb 9.6 oz (66 kg)    Height: 6' (1.829 m)     BP Readings from Last 3 Encounters:  01/11/24 (!) 150/72  10/08/23 (!) 157/65  07/01/23 133/67      Physical Exam Vitals reviewed.  Constitutional:      Appearance: Normal appearance.     Comments: BMI 19.75  HENT:     Head: Normocephalic and atraumatic.     Right Ear: Tympanic membrane and external ear normal.     Left Ear: Tympanic membrane normal.     Ears:     Comments: Removed cerumen with curette left ear tolerated well    Nose: Nose normal.  Cardiovascular:     Rate and Rhythm: Normal rate and regular rhythm.  Pulmonary:     Effort: Pulmonary effort is normal.     Breath sounds: Normal breath sounds.  Abdominal:     General: Abdomen is flat. Bowel sounds are  normal.     Palpations: Abdomen is soft.  Musculoskeletal:        General: Normal range of motion.     Cervical back: Normal range of motion.  Skin:    General: Skin is warm and dry.  Neurological:     Mental Status: He is alert and oriented to person, place, and time.  Psychiatric:        Mood and Affect: Mood normal.        Behavior: Behavior normal.     Assessment & Plan  Rodney Page was seen today for hypertension.  Diagnoses and all orders for this visit:  Herpes zoster vaccination declined  Essential hypertension BP goal - < 140/90 Explained that having normal blood pressure is the goal and medications are helping to get to goal and maintain normal blood pressure. DIET: Limit salt intake, read nutrition labels to check salt content, limit fried and high fatty foods  Avoid using multisymptom OTC cold preparations that generally contain sudafed which can rise BP. Consult with pharmacist on best cold relief products to use for persons with HTN EXERCISE Discussed incorporating exercise such as walking - 30 minutes most days of the week and can do in 10 minute intervals    See HPI     Patient have been counseled extensively about nutrition and exercise. Other issues discussed during this visit include: low cholesterol diet, weight control and daily exercise, foot care, annual eye examinations at Ophthalmology, importance of adherence with medications and regular follow-up. We also discussed long term complications of uncontrolled diabetes and hypertension.   Return in about 3 months (around 04/09/2024).  The patient was given clear instructions to go to ER or return to medical center if symptoms don't improve, worsen or new problems develop. The patient verbalized understanding. The patient was told to call to get lab results if they haven't heard anything in the next week.   This note has been created with Education officer, environmental. Any  transcriptional errors are unintentional.   Grayce Sessions, NP 01/11/2024, 4:37 PM

## 2024-04-13 ENCOUNTER — Telehealth (INDEPENDENT_AMBULATORY_CARE_PROVIDER_SITE_OTHER): Payer: Self-pay | Admitting: Primary Care

## 2024-04-13 NOTE — Telephone Encounter (Signed)
 Left VM with pt about their upcoming appt.

## 2024-04-18 ENCOUNTER — Ambulatory Visit (INDEPENDENT_AMBULATORY_CARE_PROVIDER_SITE_OTHER): Payer: 59 | Admitting: Primary Care

## 2024-06-06 ENCOUNTER — Other Ambulatory Visit: Payer: Self-pay | Admitting: Family

## 2024-06-06 DIAGNOSIS — I70223 Atherosclerosis of native arteries of extremities with rest pain, bilateral legs: Secondary | ICD-10-CM

## 2024-06-07 ENCOUNTER — Ambulatory Visit
Admission: RE | Admit: 2024-06-07 | Discharge: 2024-06-07 | Disposition: A | Discharge status: Home / Self Care | Payer: Self-pay | Source: Ambulatory Visit | Attending: Family | Admitting: Family

## 2024-06-07 DIAGNOSIS — I70223 Atherosclerosis of native arteries of extremities with rest pain, bilateral legs: Secondary | ICD-10-CM

## 2024-08-01 ENCOUNTER — Ambulatory Visit (INDEPENDENT_AMBULATORY_CARE_PROVIDER_SITE_OTHER): Payer: Medicare (Managed Care) | Admitting: Podiatry

## 2024-08-01 ENCOUNTER — Encounter: Payer: Self-pay | Admitting: Podiatry

## 2024-08-01 DIAGNOSIS — B351 Tinea unguium: Secondary | ICD-10-CM | POA: Diagnosis not present

## 2024-08-01 DIAGNOSIS — M79676 Pain in unspecified toe(s): Secondary | ICD-10-CM | POA: Diagnosis not present

## 2024-08-01 DIAGNOSIS — D126 Benign neoplasm of colon, unspecified: Secondary | ICD-10-CM | POA: Insufficient documentation

## 2024-08-01 DIAGNOSIS — E785 Hyperlipidemia, unspecified: Secondary | ICD-10-CM | POA: Insufficient documentation

## 2024-08-01 NOTE — Progress Notes (Signed)
   Chief Complaint  Patient presents with   Callouses    4th toe (tip) bilateral/plantar forefoot left - callused areas x several months, tried trimming the right, but can't get to the left foot, used preparation H cream, also concerned about thick dark nails   New Patient (Initial Visit)    SUBJECTIVE Patient presents to office today complaining of elongated, thickened nails that cause pain while ambulating in shoes.  Patient is unable to trim their own nails. Patient is here for further evaluation and treatment.  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 Derm Skin is dry and supple bilateral. Negative open lesions or macerations. Remaining integument unremarkable. Nails are tender, long, thickened and dystrophic with subungual debris, consistent with onychomycosis, 1-5 bilateral. No signs of infection noted. Vasc  DP and PT pedal pulses palpable bilaterally. Temperature gradient within normal limits.  Neuro Epicritic and protective threshold sensation grossly intact bilaterally.  Musculoskeletal Exam No symptomatic pedal deformities noted bilateral. Muscular strength within normal limits.  ASSESSMENT 1.  Pain due to onychomycosis of toenails both  PLAN OF CARE 1. Patient evaluated today.  2. Instructed to maintain good pedal hygiene and foot care.  3. Mechanical debridement of nails 1-5 bilaterally performed using a nail nipper. Filed  with dremel without incident.  4. Return to clinic in 3 mos.    Thresa EMERSON Sar, DPM [facility]  Dr. Thresa EMERSON Sar, DPM    [address] [place], [place] [address]                Office [phone]  Fax [phone]
